# Patient Record
Sex: Female | Born: 1950 | Race: White | Hispanic: No | Marital: Married | Smoking: Never smoker
Health system: Southern US, Community
[De-identification: ages and names within clinical notes are randomized; demographics above are authoritative.]

## PROBLEM LIST (undated history)

## (undated) DIAGNOSIS — K644 Residual hemorrhoidal skin tags: Secondary | ICD-10-CM

## (undated) DIAGNOSIS — M1990 Unspecified osteoarthritis, unspecified site: Secondary | ICD-10-CM

## (undated) DIAGNOSIS — R7301 Impaired fasting glucose: Secondary | ICD-10-CM

## (undated) DIAGNOSIS — K5790 Diverticulosis of intestine, part unspecified, without perforation or abscess without bleeding: Secondary | ICD-10-CM

## (undated) DIAGNOSIS — E785 Hyperlipidemia, unspecified: Secondary | ICD-10-CM

## (undated) DIAGNOSIS — N8502 Endometrial intraepithelial neoplasia [EIN]: Secondary | ICD-10-CM

## (undated) DIAGNOSIS — K648 Other hemorrhoids: Secondary | ICD-10-CM

## (undated) DIAGNOSIS — Z780 Asymptomatic menopausal state: Secondary | ICD-10-CM

## (undated) DIAGNOSIS — I10 Essential (primary) hypertension: Secondary | ICD-10-CM

## (undated) DIAGNOSIS — N926 Irregular menstruation, unspecified: Secondary | ICD-10-CM

## (undated) HISTORY — DX: Asymptomatic menopausal state: Z78.0

## (undated) HISTORY — DX: Hyperlipidemia, unspecified: E78.5

## (undated) HISTORY — DX: Essential (primary) hypertension: I10

## (undated) HISTORY — DX: Unspecified osteoarthritis, unspecified site: M19.90

## (undated) HISTORY — DX: Diverticulosis of intestine, part unspecified, without perforation or abscess without bleeding: K57.90

## (undated) HISTORY — DX: Endometrial intraepithelial neoplasia (EIN): N85.02

## (undated) HISTORY — DX: Residual hemorrhoidal skin tags: K64.4

## (undated) HISTORY — PX: TUBAL LIGATION: SHX77

## (undated) HISTORY — DX: Impaired fasting glucose: R73.01

## (undated) HISTORY — DX: Other hemorrhoids: K64.8

## (undated) HISTORY — DX: Irregular menstruation, unspecified: N92.6

---

## 2004-08-18 HISTORY — PX: TOTAL ABDOMINAL HYSTERECTOMY W/ BILATERAL SALPINGOOPHORECTOMY: SHX83

## 2004-08-25 LAB — HM MAMMOGRAPHY: HM Mammogram: 2

## 2004-08-25 LAB — HM COLONOSCOPY

## 2004-11-14 ENCOUNTER — Ambulatory Visit: Payer: Self-pay | Admitting: Gastroenterology

## 2004-11-15 ENCOUNTER — Ambulatory Visit: Payer: Self-pay | Admitting: Gastroenterology

## 2005-03-12 ENCOUNTER — Other Ambulatory Visit: Admission: RE | Admit: 2005-03-12 | Discharge: 2005-03-12 | Payer: Self-pay | Admitting: Family Medicine

## 2005-04-11 ENCOUNTER — Encounter: Admission: RE | Admit: 2005-04-11 | Discharge: 2005-04-11 | Payer: Self-pay | Admitting: Obstetrics and Gynecology

## 2007-04-26 LAB — HM PAP SMEAR

## 2007-05-12 ENCOUNTER — Other Ambulatory Visit: Admission: RE | Admit: 2007-05-12 | Discharge: 2007-05-12 | Payer: Self-pay | Admitting: Obstetrics and Gynecology

## 2007-08-19 HISTORY — PX: KNEE ARTHROSCOPY: SUR90

## 2007-11-04 ENCOUNTER — Encounter: Admission: RE | Admit: 2007-11-04 | Discharge: 2007-11-04 | Payer: Self-pay | Admitting: Family Medicine

## 2007-11-25 ENCOUNTER — Ambulatory Visit (HOSPITAL_COMMUNITY): Admission: RE | Admit: 2007-11-25 | Discharge: 2007-11-25 | Payer: Self-pay | Admitting: Orthopedic Surgery

## 2009-06-19 IMAGING — CR DG CHEST 2V
2 series · 2 of 2 positions shown · non-contrast
Comparison: None

CLINICAL DATA: Preop for right knee arthroscopy

CHEST - 2 VIEW

[view not recorded (1 of 2)]
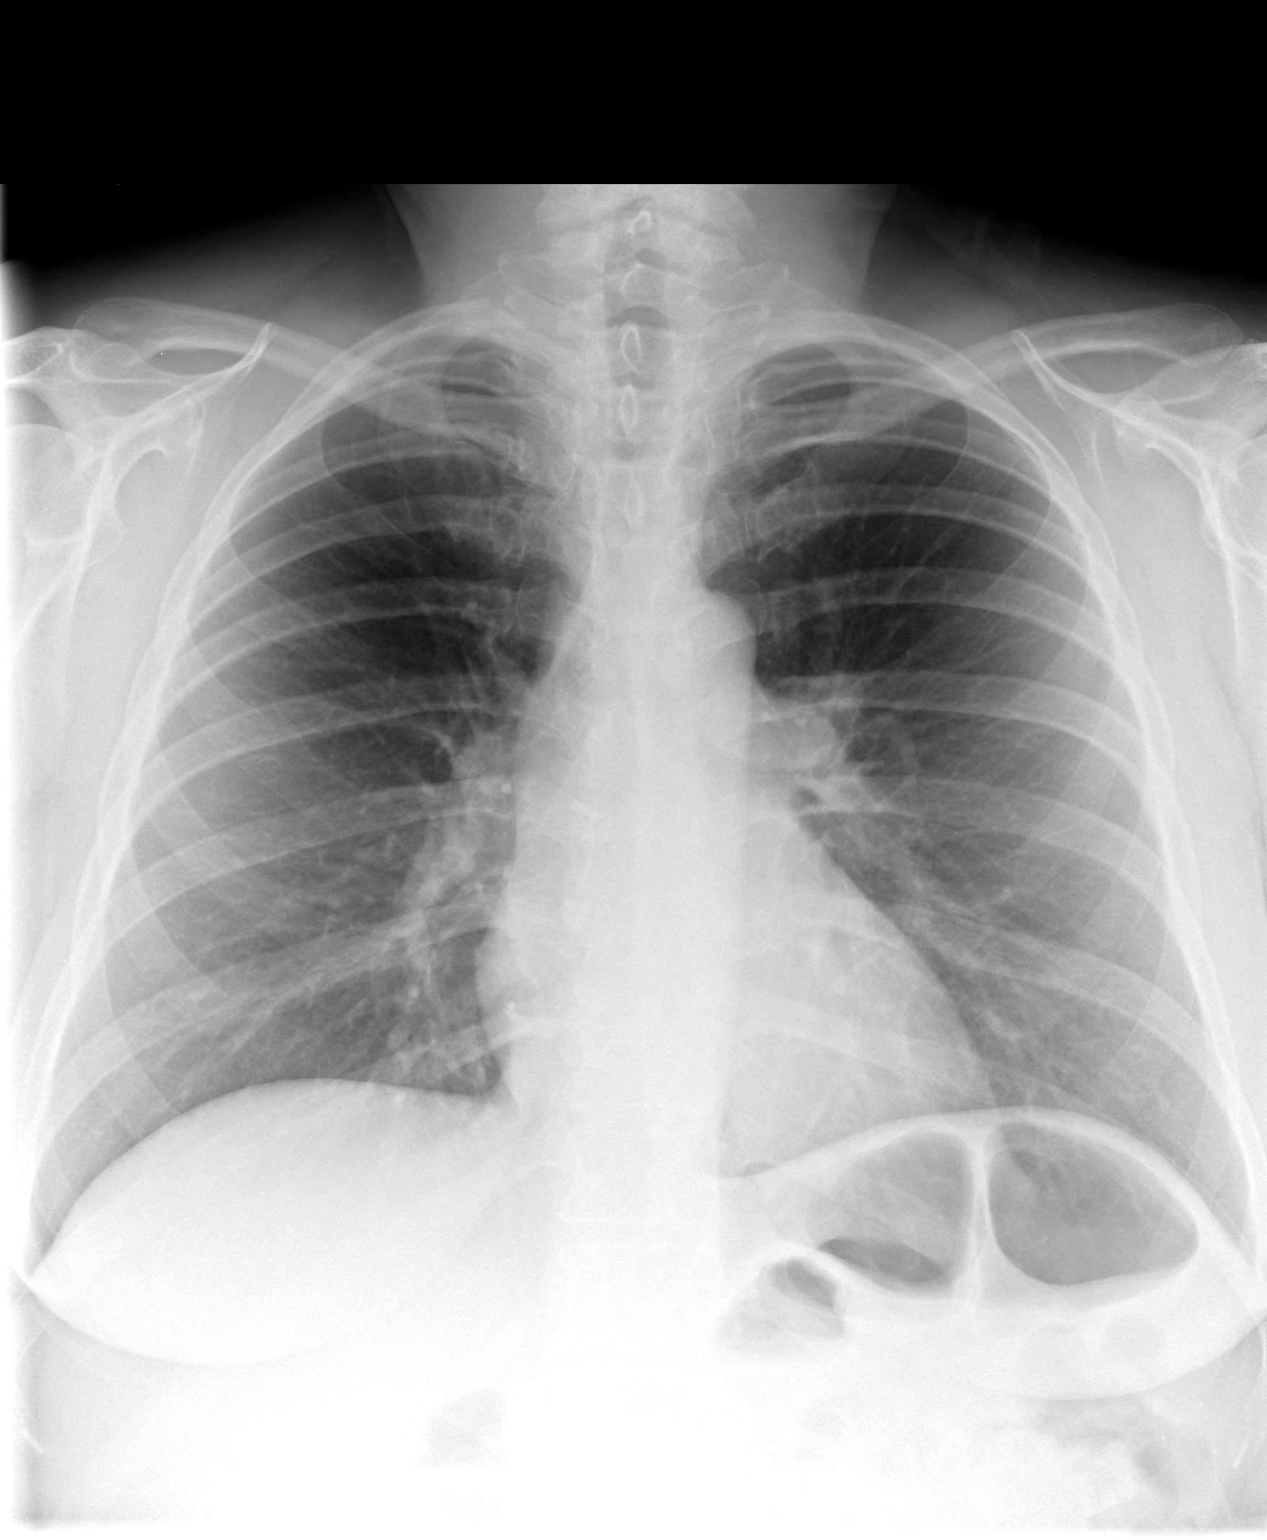

[view not recorded (2 of 2)]
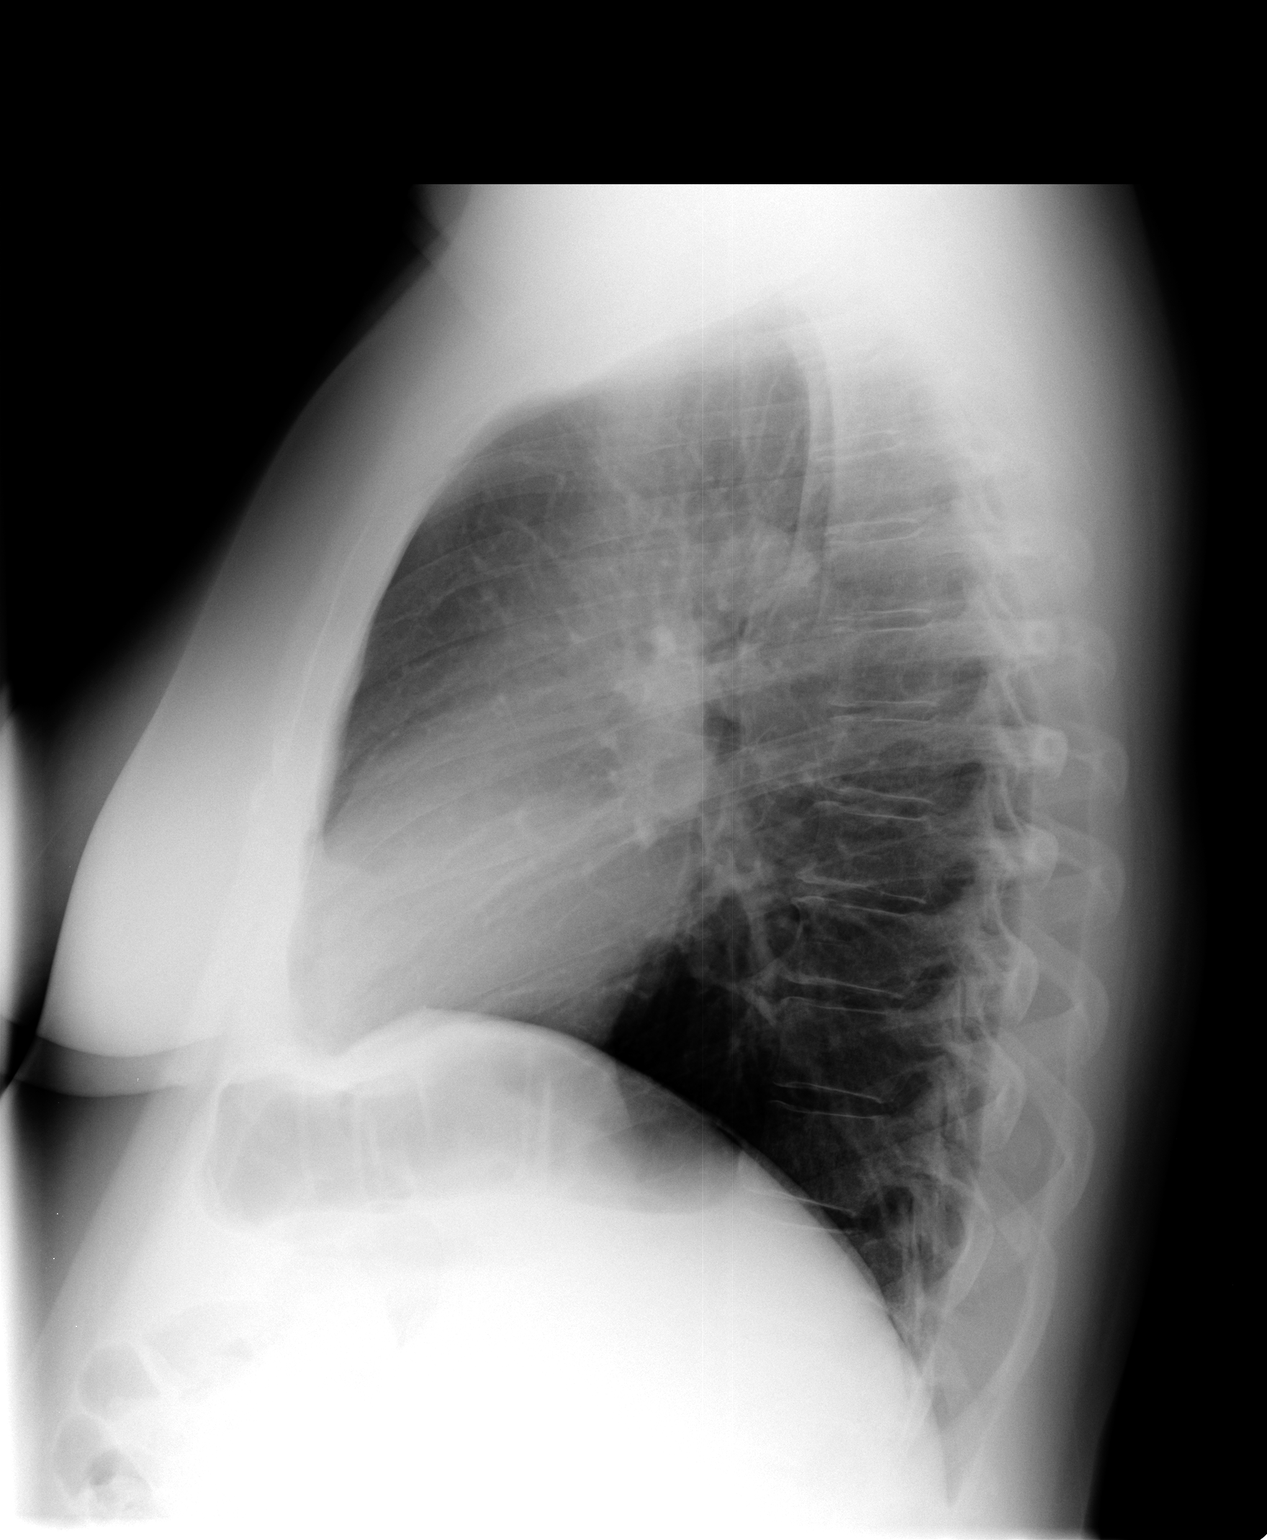

[2 of 2 positions shown; findings below may reference images not displayed]

FINDINGS: Heart and lungs normal.  There is slight fullness of the
left hilum that looks like a minimally prominent pulmonary artery.
I cannot exclude left hilar adenopathy.  I think this is unlikely
to be clinically significant.  However, if the patient has had an
outside chest x-rays, it would be worthwhile attempting to obtain
those images for comparison, or at least reports of the prior
exams, if possible. If none are available, I would recommend a
follow-up chest x-ray in 6 months.  I see no need for CT at this
time.

Osseous structures intact.
IMPRESSION: 1.  Slight prominence of the left hilum - probably vascular.
Cannot rule out left hilar adenopathy.  See above discussion.
2.  Lungs clear.

## 2010-12-31 NOTE — Op Note (Signed)
NAME:  Taylor Jones, Taylor Jones                 ACCOUNT NO.:  [id number]   MEDICAL RECORD NO.:  [id number]          PATIENT TYPE:  AMB   LOCATION:  DAY                          FACILITY:  [facility]   PHYSICIAN:  Georges Lynch. Gioffre, M.D.DATE OF BIRTH:  July 10, 1951   DATE OF PROCEDURE:  11/25/2007  DATE OF DISCHARGE:                               OPERATIVE REPORT   SURGEON:  Georges Lynch. Darrelyn Hillock, M.D.   ASSISTANT:  Nurse.   PREOPERATIVE DIAGNOSIS:  Degenerative arthritis of the right knee.   POSTOPERATIVE DIAGNOSIS:  Degenerative arthritis of the right knee.   PROCEDURE:  1. Diagnostic arthroscopy of the right knee.  2. Abrasion chondroplasty of the medial femoral condyle, right knee.  3. Partial medial meniscectomy, posterior horn, right knee.  4. Partial lateral meniscectomy, right knee.  5. Synovectomy, suprapatellar pouch, right knee.   PROCEDURE:  Under general anesthesia, routine orthopedic prep and drape  of the right lower extremity was carried out.  The patient had 2 grams  of IV Ancef.   At this time, a small punctate incision was made in suprapatellar pouch.  Inflow cannula was inserted, and the knee was distended with saline.  Another small punctate incision was made in the anterolateral joint.  The arthroscope was entered with a lateral approach, and a complete  diagnostic arthroscopy was carried out.  I went up into the  suprapatellar pouch.  She had severe synovitis.  I introduced the  ArthroCare and did a partial synovectomy.  I went back down into the  lateral joint.  She had a torn lateral meniscus in the midportion.  I  introduced the shaver suction device and did a partial lateral  meniscectomy.  I then inspected the cruciates.  They were intact.  In  the medial joint, she had SIGNIFICANT changes.  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  I introduced the shaver  suction device and did an abrasion chondroplasty.  I also did a partial  medial meniscectomy  of the posterior horn.  I did introduce the nerve  hook to make sure there were no other problems with the medial meniscus.  It was rather bound down nicely to the ligamentous structures.  I  thoroughly irrigated out the area, and there no other abnormalities  noted.  I removed the fluid, closed all three punctate incisions with 3-  0 nylon suture.  I injected 30 mL of 0.5% Marcaine with epinephrine into  the knee joint.  A sterile Neosporin bundle dressing was applied.   Postoperatively, she will be on aspirin 325 mg b.i.d. today and b.i.d.  for 2 weeks, she will be on Dilaudid, 2 mg every 4 hours p.r.n. for  pain.  She will ambulate with a walker, partial to full weightbearing as  tolerated.  She will call the office to see me in 12-14 days or prior to  that if there are any problem.           ______________________________  Georges Lynch Darrelyn Hillock, M.D.     RAG/MEDQ  D:  11/25/2007  T:  11/25/2007  Job:  [id number] 

## 2011-05-13 LAB — COMPREHENSIVE METABOLIC PANEL
AST: 17
Alkaline Phosphatase: 49
CO2: 30
GFR calc non Af Amer: >60
Glucose, Bld: 114 — ABNORMAL HIGH
Total Bilirubin: 1

## 2011-05-13 LAB — CBC
HCT: 42.5
Platelets: 220
RDW: 12.7

## 2011-08-26 ENCOUNTER — Encounter: Payer: Self-pay | Admitting: Internal Medicine

## 2011-09-01 ENCOUNTER — Ambulatory Visit (INDEPENDENT_AMBULATORY_CARE_PROVIDER_SITE_OTHER): Payer: BC Managed Care – PPO | Admitting: Family Medicine

## 2011-09-01 ENCOUNTER — Encounter: Payer: Self-pay | Admitting: Family Medicine

## 2011-09-01 VITALS — BP 120/86 | HR 68 | Ht 66.0 in | Wt 228.0 lb

## 2011-09-01 DIAGNOSIS — R5381 Other malaise: Secondary | ICD-10-CM

## 2011-09-01 DIAGNOSIS — Z23 Encounter for immunization: Secondary | ICD-10-CM

## 2011-09-01 DIAGNOSIS — R7301 Impaired fasting glucose: Secondary | ICD-10-CM

## 2011-09-01 DIAGNOSIS — E559 Vitamin D deficiency, unspecified: Secondary | ICD-10-CM | POA: Insufficient documentation

## 2011-09-01 DIAGNOSIS — Z0000 Encounter for general adult medical examination without abnormal findings: Secondary | ICD-10-CM

## 2011-09-01 DIAGNOSIS — R5383 Other fatigue: Secondary | ICD-10-CM

## 2011-09-01 DIAGNOSIS — E7800 Pure hypercholesterolemia, unspecified: Secondary | ICD-10-CM | POA: Insufficient documentation

## 2011-09-01 DIAGNOSIS — I10 Essential (primary) hypertension: Secondary | ICD-10-CM | POA: Insufficient documentation

## 2011-09-01 LAB — CBC WITH DIFFERENTIAL/PLATELET
Basophils Absolute: 0 10*3/uL (ref 0.0–0.1)
HCT: 49.9 % — ABNORMAL HIGH (ref 36.0–46.0)
Hemoglobin: 16.4 g/dL — ABNORMAL HIGH (ref 12.0–15.0)
Neutro Abs: 3.3 10*3/uL (ref 1.7–7.7)
Platelets: 220 10*3/uL (ref 150–400)
RBC: 5.37 MIL/uL — ABNORMAL HIGH (ref 3.87–5.11)
RDW: 13 % (ref 11.5–15.5)
WBC: 6.3 10*3/uL (ref 4.0–10.5)

## 2011-09-01 LAB — POCT URINALYSIS DIPSTICK
Bilirubin, UA: NEGATIVE
Ketones, UA: NEGATIVE
Leukocytes, UA: NEGATIVE
Protein, UA: NEGATIVE
Spec Grav, UA: 1.01
Urobilinogen, UA: NEGATIVE
pH, UA: 5

## 2011-09-01 LAB — COMPREHENSIVE METABOLIC PANEL
ALT: 66 U/L — ABNORMAL HIGH (ref 0–35)
Alkaline Phosphatase: 68 U/L (ref 39–117)
BUN: 12 mg/dL (ref 6–23)
CO2: 29 mEq/L (ref 19–32)
Glucose, Bld: 154 mg/dL — ABNORMAL HIGH (ref 70–99)
Sodium: 140 mEq/L (ref 135–145)
Total Bilirubin: 0.6 mg/dL (ref 0.3–1.2)

## 2011-09-01 LAB — LIPID PANEL
HDL: 50 mg/dL (ref >39)
LDL Cholesterol: 106 mg/dL — ABNORMAL HIGH (ref 0–99)
Total CHOL/HDL Ratio: 3.6 Ratio

## 2011-09-01 LAB — HEMOGLOBIN A1C: Hgb A1c MFr Bld: 8.5 % — ABNORMAL HIGH (ref <5.7)

## 2011-09-01 MED ORDER — AMLODIPINE BESYLATE 10 MG PO TABS: 10.0000 mg | ORAL_TABLET | Freq: Every day | ORAL | Status: DC

## 2011-09-01 MED ORDER — SIMVASTATIN 20 MG PO TABS: 20.0000 mg | ORAL_TABLET | Freq: Every evening | ORAL | Status: DC

## 2011-09-01 NOTE — Progress Notes (Signed)
Taylor Jones is a 61 y.o. female who presents for a complete physical.  She has the following concerns:  Med check for HTN and cholesterol.  Needs refills and labs.  HTN:  Doesn't check BP elsewhere.  Denies headaches or dizziness. Denies chest pain, palpitations, edema Chol:  Denies side effects to medications, myalgias.  Trying to follow lowfat, low cholesterol diet, lost 11 pounds since her mother passed away in the fall. ? If needs pap.  Is s/p hysterectomy for atypical hyperplasia. Had some bleeding related to granulation tissue for a while, requiring silver nitrate treatments (many), but hasn't had any recent problems.  Immunization History  Administered Date(s) Administered  . Tdap 05/10/2007   Last Pap smear: can't recall (9/08 per records) Last mammogram: approx 4 years ago Last colonoscopy: 1/06 Last DEXA: never Dentist: twice yearly Ophtho: every 2 years, scheduled for later this month Exercise: 30 minutes daily, walking, 4-5 days/week  Past Medical History  Diagnosis Date  . Hypertension   . Hyperlipidemia   . Impaired fasting glucose   . Osteoarthritis     of hands and knees  . Irregular menses   . Postmenopausal   . Internal hemorrhoids   . External hemorrhoids   . Diverticulosis   . Atypical endometrial hyperplasia     s/p TAH-BSO.  had bleeding from granulation tissue (requiring treatment with silver nitrate, eventually resolved)    Past Surgical History  Procedure Date  . Cesarean section     x3  . Total abdominal hysterectomy w/ bilateral salpingoophorectomy 01-Feb-2005  . Tubal ligation   . Knee arthroscopy 02-02-08    R knee, meniscal tear, Dr. Darrelyn Hillock    History   Social History  . Marital Status: Married    Spouse Name: N/A    Number of Children: 3  . Years of Education: N/A   Occupational History  . Not on file.   Social History Main Topics  . Smoking status: Never Smoker   . Smokeless tobacco: Never Used  . Alcohol Use: Yes     Maybe one drink  per week.  . Drug Use: No  . Sexually Active: Yes -- Female partner(s)   Other Topics Concern  . Not on file   Social History Narrative   Lives with husband.  Children live in [place] and [place].  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 Diabetes Mother     diet controlled  . Hypertension Mother   . Hyperlipidemia Mother   . Dementia Mother   . Heart attack Father   . Hyperlipidemia Father   . Hypertension Sister   . Hyperlipidemia Sister   . Osteoarthritis Sister   . Heart attack Brother   . Hyperlipidemia Brother   . Diabetes Maternal Aunt   . Diabetes Maternal Grandmother    Current outpatient prescriptions:amLODipine (NORVASC) 10 MG tablet, Take 1 tablet (10 mg total) by mouth daily., Disp: 90 tablet, Rfl: 1;  Glucosamine-Chondroit-Vit C-Mn (GLUCOSAMINE-CHONDROITIN) CAPS, Take 1 capsule by mouth daily. 1500/1200, Disp: , Rfl: ;  Multiple Vitamins-Minerals (MULTIVITAMIN WITH MINERALS) tablet, Take 1 tablet by mouth daily.  , Disp: , Rfl:  simvastatin (ZOCOR) 20 MG tablet, Take 1 tablet (20 mg total) by mouth every evening., Disp: 90 tablet, Rfl: 1;  DISCONTD: amLODipine (NORVASC) 10 MG tablet, Take 10 mg by mouth daily.  , Disp: , Rfl: ;  DISCONTD: simvastatin (ZOCOR) 20 MG  tablet, Take 20 mg by mouth every evening.  , Disp: , Rfl:   No Known Allergies  ROS: The patient denies anorexia, fever,  Headaches (occasional tension headache),  vision changes, ear pain, sore throat, breast concerns (but doesn't do self breast exams), chest pain, palpitations, dizziness, syncope, dyspnea on exertion, cough, swelling, nausea, vomiting, diarrhea, constipation, abdominal pain, melena, hematochezia, indigestion/heartburn, hematuria, incontinence, dysuria, vaginal bleeding, discharge, odor or itch, genital lesions, joint pains, numbness, tingling, weakness, tremor, suspicious skin lesions, depression, anxiety, abnormal  bleeding/bruising, or enlarged lymph nodes. +intentional weight loss; some decreased hearing noted. +indigestion--better if avoids caffeine. +arthritis pain in hands, feet, knees.  Moods and sleep are much improved since her mother passed away.  PHYSICAL EXAM: BP 120/86  Pulse 68  Ht 5\' 6"  (1.676 m)  Wt 228 lb (103.42 kg)  BMI 36.80 kg/m2  General Appearance:    Alert, cooperative, no distress, appears stated age  Head:    Normocephalic, without obvious abnormality, atraumatic  Eyes:    PERRL, conjunctiva/corneas clear, EOM's intact, fundi    benign  Ears:    Normal TM's and external ear canals  Nose:   Nares normal, mucosa normal, no drainage or sinus   tenderness  Throat:   Lips, mucosa, and tongue normal; teeth and gums normal  Neck:   Supple, no lymphadenopathy;  thyroid:  no   enlargement/tenderness/nodules; no carotid   bruit or JVD  Back:    Spine nontender, no curvature, ROM normal, no CVA     tenderness  Lungs:     Clear to auscultation bilaterally without wheezes, rales or     ronchi; respirations unlabored  Chest Wall:    No tenderness or deformity   Heart:    Regular rate and rhythm, S1 and S2 normal, no murmur, rub   or gallop  Breast Exam:    No tenderness, masses, or nipple discharge or inversion.      No axillary lymphadenopathy  Abdomen:     Soft, non-tender, nondistended, normoactive bowel sounds,    no masses, no hepatosplenomegaly  Genitalia:    Normal external genitalia without lesions.  BUS and vagina normal, mild atrophic changes.  Normal appearing vaginal cuff. No vaginal discharge.  Bimanual exam normal--no mass or adnexal tenderness.  Uterus surgically absent.  Rectal:    Normal tone, no masses or tenderness; guaiac positive stool (inflamed hemorrhoid, bled this morning per pt)  Extremities:   No clubbing, cyanosis or edema  Pulses:   2+ and symmetric all extremities  Skin:   Skin color, texture, turgor normal, no rashes or lesions  Lymph nodes:   Cervical,  supraclavicular, and axillary nodes normal  Neurologic:   CNII-XII intact, normal strength, sensation and gait; reflexes 2+ and symmetric throughout          Psych:   Normal mood, affect, hygiene and grooming.     ASSESSMENT/PLAN:  1. Routine general medical examination at a health care facility  POCT Urinalysis Dipstick  2. Need for prophylactic vaccination and inoculation against influenza  Flu vaccine greater than or equal to 3yo preservative free IM  3. Essential hypertension, benign  Comprehensive metabolic panel, amLODipine (NORVASC) 10 MG tablet  4. Pure hypercholesterolemia  Lipid panel, simvastatin (ZOCOR) 20 MG tablet  5. Unspecified vitamin D deficiency  Vitamin D 25 hydroxy  6. Other malaise and fatigue  CBC with Differential, TSH  7. Impaired fasting glucose  Hemoglobin A1c   Heme+ stool related to hemorrhoids.  Kit given, to  do/return after hemorrhoid flare resolves.  Discussed monthly self breast exams and yearly mammograms; at least 30 minutes of aerobic activity at least 5 days/week; proper sunscreen use reviewed; healthy diet, including goals of calcium and vitamin D intake and alcohol recommendations (less than or equal to 1 drink/day) reviewed; regular seatbelt use; changing batteries in smoke detectors.  Immunization recommendations discussed--UTD.  Colonoscopy recommendations reviewed--UTD  Shingles vaccine next year, check insurance coverage Yearly mammograms are strongly encouraged, and she is agreeable. Will call to schedule mammogram.  F/u 1 year, sooner if dizziness or other concerns.  Periodically check BP elsewhere, return sooner if persistently >135-140/85-90

## 2011-09-01 NOTE — Patient Instructions (Addendum)
HEALTH MAINTENANCE RECOMMENDATIONS:  It is recommended that you get at least 30 minutes of aerobic exercise at least 5 days/week (for weight loss, you may need as much as 60-90 minutes). This can be any activity that gets your heart rate up. This can be divided in 10-15 minute intervals if needed, but try and build up your endurance at least once a week.  Weight bearing exercise is also recommended twice weekly.  Eat a healthy diet with lots of vegetables, fruits and fiber.  "Colorful" foods have a lot of vitamins (ie green vegetables, tomatoes, red peppers, etc).  Limit sweet tea, regular sodas and alcoholic beverages, all of which has a lot of calories and sugar.  Up to 1 alcoholic drink daily may be beneficial for women (unless trying to lose weight, watch sugars).  Drink a lot of water.  Calcium recommendations are 1200-1500 mg daily (1500 mg for postmenopausal women or women without ovaries), and vitamin D 1000 IU daily.  This should be obtained from diet and/or supplements (vitamins), and calcium should not be taken all at once, but in divided doses.  Monthly self breast exams and yearly mammograms for women over the age of 35 is recommended.  Sunscreen of at least SPF 30 should be used on all sun-exposed parts of the skin when outside between the hours of 10 am and 4 pm (not just when at beach or pool, but even with exercise, golf, tennis, and yard work!)  Use a sunscreen that says "broad spectrum" so it covers both UVA and UVB rays, and make sure to reapply every 1-2 hours.  Remember to change the batteries in your smoke detectors when changing your clock times in the spring and fall.  Use your seat belt every time you are in a car, and please drive safely and not be distracted with cell phones and texting while driving.  Zostavax (shingles vaccine) due age 3.  I recommend you check your insurance coverage for this vaccine before your physical next year.  Check BP about once a month, and  write down..  Do stool cards when your hemorrhoids aren't flaring.

## 2011-09-02 ENCOUNTER — Encounter: Payer: Self-pay | Admitting: Family Medicine

## 2011-09-02 DIAGNOSIS — E1165 Type 2 diabetes mellitus with hyperglycemia: Secondary | ICD-10-CM

## 2011-09-02 LAB — VITAMIN D 25 HYDROXY (VIT D DEFICIENCY, FRACTURES): Vit D, 25-Hydroxy: 42 ng/mL (ref 30–89)

## 2011-09-15 ENCOUNTER — Encounter: Payer: Self-pay | Admitting: Family Medicine

## 2011-09-15 ENCOUNTER — Ambulatory Visit (INDEPENDENT_AMBULATORY_CARE_PROVIDER_SITE_OTHER): Payer: BC Managed Care – PPO | Admitting: Family Medicine

## 2011-09-15 VITALS — BP 118/78 | HR 72 | Ht 66.0 in | Wt 227.0 lb

## 2011-09-15 DIAGNOSIS — R945 Abnormal results of liver function studies: Secondary | ICD-10-CM

## 2011-09-15 DIAGNOSIS — IMO0001 Reserved for inherently not codable concepts without codable children: Secondary | ICD-10-CM

## 2011-09-15 DIAGNOSIS — I10 Essential (primary) hypertension: Secondary | ICD-10-CM

## 2011-09-15 DIAGNOSIS — E7800 Pure hypercholesterolemia, unspecified: Secondary | ICD-10-CM

## 2011-09-15 DIAGNOSIS — R7989 Other specified abnormal findings of blood chemistry: Secondary | ICD-10-CM

## 2011-09-15 MED ORDER — METFORMIN HCL ER 500 MG PO TB24: ORAL_TABLET | ORAL | Status: DC

## 2011-09-15 NOTE — Progress Notes (Signed)
Patient presents for follow up on labs.  Labs revealed a new diagnosis of diabetes.  She previously had some impaired fasting glucose. Admits to very poor diet through November, eating a lot of sweets, and not able to exercise, related to her mother's health and death.  Started eating better and exercising some in Mid-November, but wasn't as good over Christmas.  Since January she has been exercising more and on a better diet. Exercising 30 minutes daily, 5 days/week.  Has lost some weight per her scale at home.  Denies excessive thirty or urination.  She has an appointment with eye doctor for next week.  She denies any numbness/tingling/pain in feet, or any sores  Past Medical History  Diagnosis Date  . Hypertension   . Hyperlipidemia   . Impaired fasting glucose   . Osteoarthritis     of hands and knees  . Irregular menses   . Postmenopausal   . Internal hemorrhoids   . External hemorrhoids   . Diverticulosis   . Atypical endometrial hyperplasia     s/p TAH-BSO.  had bleeding from granulation tissue (requiring treatment with silver nitrate, eventually resolved)  . Diabetes mellitus     Past Surgical History  Procedure Date  . Cesarean section     x3  . Total abdominal hysterectomy w/ bilateral salpingoophorectomy 08-Feb-2005  . Tubal ligation   . Knee arthroscopy 2008/02/09    R knee, meniscal tear, Dr. Darrelyn Hillock    History   Social History  . Marital Status: Married    Spouse Name: N/A    Number of Children: 3  . Years of Education: N/A   Occupational History  . Not on file.   Social History Main Topics  . Smoking status: Never Smoker   . Smokeless tobacco: Never Used  . Alcohol Use: Yes     Maybe one drink per week.  . Drug Use: No  . Sexually Active: Yes -- Female partner(s)   Other Topics Concern  . Not on file   Social History Narrative   Lives with husband.  Children live in [place] and [place].  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 Diabetes Mother     diet controlled  . Hypertension Mother   . Hyperlipidemia Mother   . Dementia Mother   . Heart attack Father   . Hyperlipidemia Father   . Hypertension Sister   . Hyperlipidemia Sister   . Osteoarthritis Sister   . Heart attack Brother   . Hyperlipidemia Brother   . Diabetes Maternal Aunt   . Diabetes Maternal Grandmother    Current Outpatient Prescriptions on File Prior to Visit  Medication Sig Dispense Refill  . amLODipine (NORVASC) 10 MG tablet Take 1 tablet (10 mg total) by mouth daily.  90 tablet  1  . Glucosamine-Chondroit-Vit C-Mn (GLUCOSAMINE-CHONDROITIN) CAPS Take 1 capsule by mouth daily. 1500/1200      . Multiple Vitamins-Minerals (MULTIVITAMIN WITH MINERALS) tablet Take 1 tablet by mouth daily.        . simvastatin (ZOCOR) 20 MG tablet Take 1 tablet (20 mg total) by mouth every evening.  90 tablet  1   No Known Allergies  ROS:  Denies chest pain, headaches, dizziness, GI complaints or other concerns  PHYSICAL EXAM: BP 118/78  Pulse 72  Ht 5\' 6"  (1.676 m)  Wt 227 lb (102.967 kg)  BMI 36.64 kg/m2 Well developed,  pleasant female in no distress Normal diabetic foot exam Some discoloration of toenails consistent with onychomycosis Psych: normal mood, affect, hygiene and grooming  Lab Results  Component Value Date   HGBA1C 8.5* 09/01/2011     Chemistry      Component Value Date/Time   NA 140 09/01/2011 1205   K 4.1 09/01/2011 1205   CL 102 09/01/2011 1205   CO2 29 09/01/2011 1205   BUN 12 09/01/2011 1205   CREATININE 0.91 09/01/2011 1205   CREATININE 0.81 11/23/2007 0825      Component Value Date/Time   CALCIUM 9.4 09/01/2011 1205   ALKPHOS 68 09/01/2011 1205   AST 49* 09/01/2011 1205   ALT 66* 09/01/2011 1205   BILITOT 0.6 09/01/2011 1205     Lab Results  Component Value Date   CHOL 180 09/01/2011   HDL 50 09/01/2011   LDLCALC 106* 09/01/2011   TRIG 119 09/01/2011   CHOLHDL 3.6 09/01/2011   Lab Results   Component Value Date   WBC 6.3 09/01/2011   HGB 16.4* 09/01/2011   HCT 49.9* 09/01/2011   MCV 92.9 09/01/2011   PLT 220 09/01/2011   Lab Results  Component Value Date   TSH 1.179 09/01/2011   ASSESSMENT/PLAN: 1. Type II or unspecified type diabetes mellitus without mention of complication, uncontrolled  metFORMIN (GLUCOPHAGE-XR) 500 MG 24 hr tablet, Hemoglobin A1c, Glucose, random  2. Abnormal LFTs  Hepatic function panel  3. Pure hypercholesterolemia  Lipid panel  4. Essential hypertension, benign     Newly diagnosed diabetes.  Start checking sugars 1-2 times a day, fasting and at least 2 hours after eating (or bedtime) in evening.  (has monitor at home from her mother) Start metformin ER 500 mg once daily in the morning. Increase after a week to 1000mg  daily if no side effects (diarrhea), and sugars remain >130 in the mornings.  Hyperlipidemia: Cut back on eggs to 1-2/week.  Goal LDL<100 (ideally <70).  Elevated LFT's--avoid alcohol, excessive Tylenol  HTN--well controlled.   Educated regarding low cholesterol diet, low carb diet, need for daily exercise and weight loss  F/u 3 months with labs prior

## 2011-09-15 NOTE — Patient Instructions (Signed)
Start checking sugars 1-2 times a day, fasting and at least 2 hours after eating (or bedtime) in evening.   Start metformin ER 500 mg once daily in the morning.  If after a week you aren't having side effects (diarrhea), and sugars remain >130 in the mornings, then increase to 2 500 mg tabs in the morning.  If you have GI side effects (diarrhea) taking 1000 mg all at once, then you can split the dose, taking 500 mg twice daily (breakfast/dinner).   Cut back on eggs to 1-2/week.  Goal LDL<100 (ideally <70).  Diabetes Meal Planning Guide The diabetes meal planning guide is a tool to help you plan your meals and snacks. It is important for people with diabetes to manage their blood glucose (sugar) levels. Choosing the right foods and the right amounts throughout your day will help control your blood glucose. Eating right can even help you improve your blood pressure and reach or maintain a healthy weight. CARBOHYDRATE COUNTING MADE EASY When you eat carbohydrates, they turn to sugar. This raises your blood glucose level. Counting carbohydrates can help you control this level so you feel better. When you plan your meals by counting carbohydrates, you can have more flexibility in what you eat and balance your medicine with your food intake. Carbohydrate counting simply means adding up the total amount of carbohydrate grams in your meals and snacks. Try to eat about the same amount at each meal. Foods with carbohydrates are listed below. Each portion below is 1 carbohydrate serving or 15 grams of carbohydrates. Ask your dietician how many grams of carbohydrates you should eat at each meal or snack. Grains and Starches  1 slice bread.    English muffin or hotdog/hamburger bun.    cup cold cereal (unsweetened).   ? cup cooked pasta or rice.    cup starchy vegetables (corn, potatoes, peas, beans, winter squash).   1 tortilla (6 inches).    bagel.   1 waffle or pancake (size of a CD).    cup  cooked cereal.   4 to 6 small crackers.  *Whole grain is recommended. Fruit  1 cup fresh unsweetened berries, melon, papaya, pineapple.   1 small fresh fruit.    banana or mango.    cup fruit juice (4 oz unsweetened).    cup canned fruit in natural juice or water.   2 tbs dried fruit.   12 to 15 grapes or cherries.  Milk and Yogurt  1 cup fat-free or 1% milk.   1 cup soy milk.   6 oz light yogurt with sugar-free sweetener.   6 oz low-fat soy yogurt.   6 oz plain yogurt.  Vegetables  1 cup raw or  cup cooked is counted as 0 carbohydrates or a "free" food.   If you eat 3 or more servings at 1 meal, count them as 1 carbohydrate serving.  Other Carbohydrates   oz chips or pretzels.    cup ice cream or frozen yogurt.    cup sherbet or sorbet.   2 inch square cake, no frosting.   1 tbs honey, sugar, jam, jelly, or syrup.   2 small cookies.   3 squares of graham crackers.   3 cups popcorn.   6 crackers.   1 cup broth-based soup.   Count 1 cup casserole or other mixed foods as 2 carbohydrate servings.   Foods with less than 20 calories in a serving may be counted as 0 carbohydrates or a "free"  food.  You may want to purchase a book or computer software that lists the carbohydrate gram counts of different foods. In addition, the nutrition facts panel on the labels of the foods you eat are a good source of this information. The label will tell you how big the serving size is and the total number of carbohydrate grams you will be eating per serving. Divide this number by 15 to obtain the number of carbohydrate servings in a portion. Remember, 1 carbohydrate serving equals 15 grams of carbohydrate. SERVING SIZES Measuring foods and serving sizes helps you make sure you are getting the right amount of food. The list below tells how big or small some common serving sizes are.  1 oz.........4 stacked dice.   3 oz........Marland KitchenDeck of cards.   1 tsp.......Marland KitchenTip of  little finger.   1 tbs......Marland KitchenMarland KitchenThumb.   2 tbs.......Marland KitchenGolf ball.    cup......Marland KitchenHalf of a fist.   1 cup.......Marland KitchenA fist.  SAMPLE DIABETES MEAL PLAN Below is a sample meal plan that includes foods from the grain and starches, dairy, vegetable, fruit, and meat groups. A dietician can individualize a meal plan to fit your calorie needs and tell you the number of servings needed from each food group. However, controlling the total amount of carbohydrates in your meal or snack is more important than making sure you include all of the food groups at every meal. You may interchange carbohydrate containing foods (dairy, starches, and fruits). The meal plan below is an example of a 2000 calorie diet using carbohydrate counting. This meal plan has 17 carbohydrate servings. Breakfast  1 cup oatmeal (2 carb servings).    cup light yogurt (1 carb serving).   1 cup blueberries (1 carb serving).    cup almonds.  Snack  1 large apple (2 carb servings).   1 low-fat string cheese stick.  Lunch  Chicken breast salad.   1 cup spinach.    cup chopped tomatoes.   2 oz chicken breast, sliced.   2 tbs low-fat [place] dressing.   12 whole-wheat crackers (2 carb servings).   12 to 15 grapes (1 carb serving).   1 cup low-fat milk (1 carb serving).  Snack  1 cup carrots.    cup hummus (1 carb serving).  Dinner  3 oz broiled salmon.   1 cup brown rice (3 carb servings).  Snack  1  cups steamed broccoli (1 carb serving) drizzled with 1 tsp olive oil and lemon juice.   1 cup light pudding (2 carb servings).  DIABETES MEAL PLANNING WORKSHEET Your dietician can use this worksheet to help you decide how many servings of foods and what types of foods are right for you.  BREAKFAST Food Group and Servings / Carb Servings Grain/Starches __________________________________ Dairy __________________________________________ Vegetable ______________________________________ Fruit  ___________________________________________ Meat __________________________________________ Fat ____________________________________________ LUNCH Food Group and Servings / Carb Servings Grain/Starches ___________________________________ Dairy ___________________________________________ Fruit ____________________________________________ Meat ___________________________________________ Fat _____________________________________________ Laural Golden Food Group and Servings / Carb Servings Grain/Starches ___________________________________ Dairy ___________________________________________ Fruit ____________________________________________ Meat ___________________________________________ Fat _____________________________________________ SNACKS Food Group and Servings / Carb Servings Grain/Starches ___________________________________ Dairy ___________________________________________ Vegetable _______________________________________ Fruit ____________________________________________ Meat ___________________________________________ Fat _____________________________________________ DAILY TOTALS Starches _________________________ Vegetable ________________________ Fruit ____________________________ Dairy ____________________________ Meat ____________________________ Fat ______________________________ Document Released: 05/01/2005 Document Revised: 04/16/2011 Document Reviewed: 03/12/2009 [employer] Patient Information 2012 [place], [place].

## 2011-10-15 ENCOUNTER — Encounter: Payer: Self-pay | Admitting: Gastroenterology

## 2011-12-10 ENCOUNTER — Other Ambulatory Visit: Payer: BC Managed Care – PPO

## 2011-12-10 DIAGNOSIS — E7800 Pure hypercholesterolemia, unspecified: Secondary | ICD-10-CM

## 2011-12-10 DIAGNOSIS — R945 Abnormal results of liver function studies: Secondary | ICD-10-CM

## 2011-12-10 LAB — HEPATIC FUNCTION PANEL
AST: 21 U/L (ref 0–37)
Albumin: 4.5 g/dL (ref 3.5–5.2)
Alkaline Phosphatase: 55 U/L (ref 39–117)
Bilirubin, Direct: 0.1 mg/dL (ref 0.0–0.3)
Indirect Bilirubin: 0.6 mg/dL (ref 0.0–0.9)

## 2011-12-10 LAB — GLUCOSE, RANDOM: Glucose, Bld: 102 mg/dL — ABNORMAL HIGH (ref 70–99)

## 2011-12-10 LAB — LIPID PANEL
Total CHOL/HDL Ratio: 3.5 Ratio
VLDL: 20 mg/dL (ref 0–40)

## 2011-12-10 LAB — HEMOGLOBIN A1C: Hgb A1c MFr Bld: 6.4 % — ABNORMAL HIGH (ref <5.7)

## 2011-12-15 ENCOUNTER — Ambulatory Visit: Payer: BC Managed Care – PPO | Admitting: Family Medicine

## 2011-12-17 ENCOUNTER — Ambulatory Visit (INDEPENDENT_AMBULATORY_CARE_PROVIDER_SITE_OTHER): Payer: BC Managed Care – PPO | Admitting: Family Medicine

## 2011-12-17 ENCOUNTER — Encounter: Payer: Self-pay | Admitting: Family Medicine

## 2011-12-17 VITALS — BP 110/76 | HR 72 | Ht 66.0 in | Wt 209.0 lb

## 2011-12-17 DIAGNOSIS — E119 Type 2 diabetes mellitus without complications: Secondary | ICD-10-CM

## 2011-12-17 DIAGNOSIS — IMO0001 Reserved for inherently not codable concepts without codable children: Secondary | ICD-10-CM

## 2011-12-17 DIAGNOSIS — E7800 Pure hypercholesterolemia, unspecified: Secondary | ICD-10-CM

## 2011-12-17 DIAGNOSIS — I10 Essential (primary) hypertension: Secondary | ICD-10-CM

## 2011-12-17 MED ORDER — METFORMIN HCL ER 500 MG PO TB24: ORAL_TABLET | ORAL | Status: DC

## 2011-12-17 MED ORDER — ATORVASTATIN CALCIUM 20 MG PO TABS: 20.0000 mg | ORAL_TABLET | Freq: Every day | ORAL | Status: DC

## 2011-12-17 NOTE — Progress Notes (Signed)
Patient presents for f/u labs.  She was diagnosed with diabetes 3 months ago. Taking 1000 mg of metformin daily without any side effects.  Sugars are running in the low 100's, up to teens if less careful with diet.  Doesn't check it as often once the sugars improved, checking 1-2x/week or more.  Hasn't set up diabetes eye exam yet.  She has been watching her diet, exercising 30 minutes 4-5 days/week.  Checking feet regularly without concerns. She lost 18 pounds since the last visit.  Hyperlipidemia:  Was above goal LDL for diabetics at last visit, and is here to follow-up after dietary trial.  Cut back on number of eggs/week (2/wk), cheese 3-4x/week, red meat 2-3x/week.   HTN--doesn't check BP elsewhere. Denies headaches, dizziness.  Ongoing problems with hemorrhoidal bleeding since last visit, so hasn't been able to return stool cards.  Having some constipation and straining.  Doesn't feel anything on the outside.  Colonoscopy was in 2005-01-20.  Past Medical History  Diagnosis Date  . Hypertension   . Hyperlipidemia   . Impaired fasting glucose   . Osteoarthritis     of hands and knees  . Irregular menses   . Postmenopausal   . Internal hemorrhoids   . External hemorrhoids   . Diverticulosis   . Atypical endometrial hyperplasia     s/p TAH-BSO.  had bleeding from granulation tissue (requiring treatment with silver nitrate, eventually resolved)  . Diabetes mellitus     Past Surgical History  Procedure Date  . Cesarean section     x3  . Total abdominal hysterectomy w/ bilateral salpingoophorectomy 2005-01-20  . Tubal ligation   . Knee arthroscopy 01/21/2008    R knee, meniscal tear, Dr. Darrelyn Hillock    History   Social History  . Marital Status: Married    Spouse Name: N/A    Number of Children: 3  . Years of Education: N/A   Occupational History  . Not on file.   Social History Main Topics  . Smoking status: Never Smoker   . Smokeless tobacco: Never Used  . Alcohol Use: Yes     Maybe one  drink per week.  . Drug Use: No  . Sexually Active: Yes -- Female partner(s)   Other Topics Concern  . Not on file   Social History Narrative   Lives with husband.  Children live in [place] and [place].  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 Diabetes Mother     diet controlled  . Hypertension Mother   . Hyperlipidemia Mother   . Dementia Mother   . Heart attack Father   . Hyperlipidemia Father   . Hypertension Sister   . Hyperlipidemia Sister   . Osteoarthritis Sister   . Heart attack Brother   . Hyperlipidemia Brother   . Diabetes Maternal Aunt   . Diabetes Maternal Grandmother    Current Outpatient Prescriptions on File Prior to Visit  Medication Sig Dispense Refill  . amLODipine (NORVASC) 10 MG tablet Take 1 tablet (10 mg total) by mouth daily.  90 tablet  1  . Glucosamine-Chondroit-Vit C-Mn (GLUCOSAMINE-CHONDROITIN) CAPS Take 1 capsule by mouth daily. 1500/1200      . Multiple Vitamins-Minerals (MULTIVITAMIN WITH MINERALS) tablet Take 1 tablet by mouth daily.        Marland Kitchen DISCONTD: metFORMIN (GLUCOPHAGE-XR) 500 MG 24 hr tablet Take 1 tablet daily with breakfast.  Increase to 2  tablets daily with breakfast after a week  60 tablet  2  . atorvastatin (LIPITOR) 20 MG tablet Take 1 tablet (20 mg total) by mouth daily.  90 tablet  3   No Known Allergies  ROS:  Denies headaches, chest pain, palpitations, shortness of breath, edema, fever, URI symptoms, cough, GI complaints, GU complaints, skin rashes/lesions. +hemorrhoidal bleeding. See HPI.  PHYSICAL EXAM: BP 110/76  Pulse 72  Ht 5\' 6"  (1.676 m)  Wt 209 lb (94.802 kg)  BMI 33.73 kg/m2 Well developed, pleasant female in no distress Neck: no lymphadenopathy, thyromegaly or bruit Heart: regular rate and rhythm Lungs: clear Abdomen: soft, nontender, no mass Extremities: no edema, 2+ pulse, no lesions, normal sensation Psych: normal mood, affect   Lab  Results  Component Value Date   HGBA1C 6.4* 12/10/2011   Lab Results  Component Value Date   CHOL 174 12/10/2011   HDL 50 12/10/2011   LDLCALC 104* 12/10/2011   TRIG 100 12/10/2011   CHOLHDL 3.5 12/10/2011   Lab Results  Component Value Date   ALT 22 12/10/2011   AST 21 12/10/2011   ALKPHOS 55 12/10/2011   BILITOT 0.7 12/10/2011   Glucose 102 (fasting)  ASSESSMENT/PLAN: 1. Type II or unspecified type diabetes mellitus without mention of complication, not stated as uncontrolled  Hemoglobin A1c, Glucose, random  2. Essential hypertension, benign    3. Pure hypercholesterolemia  atorvastatin (LIPITOR) 20 MG tablet, Lipid panel, Hepatic function panel  4. Type II or unspecified type diabetes mellitus without mention of complication, uncontrolled  metFORMIN (GLUCOPHAGE-XR) 500 MG 24 hr tablet    DM--improved control.  Continue current medications. Schedule annual diabetic eye exam.  HTN--well controlled  Elevated LFT's 3 months ago--resolved.  Will need to monitor closely with change in statin medication.  Hyperlipidemia--not quite at goal--no significant improvement despite improvement in diet.  Change from simvastatin to atorvastatin 20mg .  Risks and side effects reviewed.  Hemorrhoidal bleeding--try OTC Cream such as Anusol.  If ongoing bleeding, consider referral back to GI.  Work on preventing flare of hemorrhoids by increasing fiber, fluids, and trial of stool softener.  F/u 3 months--A1c, LFT, lipid, glu

## 2011-12-17 NOTE — Patient Instructions (Signed)
Stop the simvastatin and change to atorvastatin (generic for Lipitor).   If you start getting any muscle aches or side effects, consider adding Coenzyme Q10.  Continue to follow low cholesterol diet.  Diabetes is much better.  Continue medication, proper diet, and daily exercise.  Continue weight loss.  Hemorrhoids--try Anusol HC suppository (or any other suppository if it is an internal hemorrhoid; the creams only work for external hemorrhoids).  High fiber diet, drink plenty of fluids, and consider adding a stool softener (ie colace) every day, to keep stools softer to prevent straining.

## 2012-03-18 ENCOUNTER — Other Ambulatory Visit: Payer: BC Managed Care – PPO

## 2012-03-18 DIAGNOSIS — E7800 Pure hypercholesterolemia, unspecified: Secondary | ICD-10-CM

## 2012-03-18 DIAGNOSIS — E119 Type 2 diabetes mellitus without complications: Secondary | ICD-10-CM

## 2012-03-18 LAB — HEPATIC FUNCTION PANEL
Albumin: 4.4 g/dL (ref 3.5–5.2)
Alkaline Phosphatase: 61 U/L (ref 39–117)
Indirect Bilirubin: 0.4 mg/dL (ref 0.0–0.9)
Total Bilirubin: 0.5 mg/dL (ref 0.3–1.2)
Total Protein: 7.4 g/dL (ref 6.0–8.3)

## 2012-03-18 LAB — LIPID PANEL
HDL: 52 mg/dL (ref >39)
LDL Cholesterol: 118 mg/dL — ABNORMAL HIGH (ref 0–99)
Triglycerides: 96 mg/dL (ref <150)

## 2012-03-18 LAB — HEMOGLOBIN A1C: Mean Plasma Glucose: 140 mg/dL — ABNORMAL HIGH (ref <117)

## 2012-03-18 LAB — GLUCOSE, RANDOM: Glucose, Bld: 117 mg/dL — ABNORMAL HIGH (ref 70–99)

## 2012-03-25 ENCOUNTER — Ambulatory Visit (INDEPENDENT_AMBULATORY_CARE_PROVIDER_SITE_OTHER): Payer: BC Managed Care – PPO | Admitting: Family Medicine

## 2012-03-25 ENCOUNTER — Encounter: Payer: Self-pay | Admitting: Family Medicine

## 2012-03-25 VITALS — BP 112/82 | HR 68 | Ht 66.0 in | Wt 216.0 lb

## 2012-03-25 DIAGNOSIS — E7800 Pure hypercholesterolemia, unspecified: Secondary | ICD-10-CM

## 2012-03-25 DIAGNOSIS — E119 Type 2 diabetes mellitus without complications: Secondary | ICD-10-CM

## 2012-03-25 DIAGNOSIS — I10 Essential (primary) hypertension: Secondary | ICD-10-CM

## 2012-03-25 MED ORDER — ROSUVASTATIN CALCIUM 10 MG PO TABS: 10.0000 mg | ORAL_TABLET | Freq: Every day | ORAL | Status: DC

## 2012-03-25 NOTE — Progress Notes (Signed)
Chief Complaint  Patient presents with  . Med check    med check-labs done 03/18/12.   HPI:  Diabetes:  Sugars are checked twice a week, ranging 105-115. Still hasn't seen the eye doctor yet, nor made appointment.  She is no longer exercising, as her arthritis has been much more painful.  Having pain in knees, hands, toes.  Pain has been worse since her statin was changed from simvastatin to atorvastatin last visit. She has regained some of the weight she had lost. Denies muscle aches, feels like it is more in her joints.  She is taking glucosamine and chondroitin, but it doesn't help.  HTN--doesn't check BP elsewhere. Denies headaches, dizziness.  Hyperlipidemia--continuing to follow low cholesterol diet--eating eggs 2/wk, red meat 3x/week.  She was changed from 20mg  of simvastatin to 20mg  of atorvastatin at last visit (when still was above LDL goal).  She has had significantly more pain since changing statins.  She was changed to atorvastatin rather than increasing simvastatin dose due to her also being on amlodipine.  Past Medical History  Diagnosis Date  . Hypertension   . Hyperlipidemia   . Impaired fasting glucose   . Osteoarthritis     of hands and knees  . Irregular menses   . Postmenopausal   . Internal hemorrhoids   . External hemorrhoids   . Diverticulosis   . Atypical endometrial hyperplasia     s/p TAH-BSO.  had bleeding from granulation tissue (requiring treatment with silver nitrate, eventually resolved)  . Diabetes mellitus    Past Surgical History  Procedure Date  . Cesarean section     x3  . Total abdominal hysterectomy w/ bilateral salpingoophorectomy 01/26/05  . Tubal ligation   . Knee arthroscopy 01-27-08    R knee, meniscal tear, Dr. Darrelyn Hillock   History   Social History  . Marital Status: Married    Spouse Name: N/A    Number of Children: 3  . Years of Education: N/A   Occupational History  . Not on file.   Social History Main Topics  . Smoking status:  Never Smoker   . Smokeless tobacco: Never Used  . Alcohol Use: Yes     Maybe one drink per week.  . Drug Use: No  . Sexually Active: Yes -- Female partner(s)   Other Topics Concern  . Not on file   Social History Narrative   Lives with husband.  Children live in [place] and [place].  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 amLODipine (NORVASC) 10 MG tablet Take 1 tablet (10 mg total) by mouth daily.  90 tablet  1  . atorvastatin (LIPITOR) 20 MG tablet Take 1 tablet (20 mg total) by mouth daily.  90 tablet  3  . Glucosamine-Chondroit-Vit C-Mn (GLUCOSAMINE-CHONDROITIN) CAPS Take 1 capsule by mouth daily. 1500/1200      . metFORMIN (GLUCOPHAGE-XR) 500 MG 24 hr tablet Take 1 tablet daily with breakfast.  Increase to 2 tablets daily with breakfast after a week  180 tablet  1  . Multiple Vitamins-Minerals (MULTIVITAMIN WITH MINERALS) tablet Take 1 tablet by mouth daily.         No Known Allergies  No longer having hemorrhoidal issues. Denies fevers, URI symptoms, headaches, chest pain, palpitations, cough, shortness of breath, GI complaints, numbness, tingling, skin rash/lesions or other concerns.  PHYSICAL EXAM: BP 112/82  Pulse 68  Ht 5\' 6"  (1.676 m)  Wt 216 lb (97.977 kg)  BMI 34.86 kg/m2 Well developed, pleasant female in no distress Neck: no lymphadenopathy, thyromegaly or carotid bruit Heart: regular rate and rhythm Lungs: clear bilaterally Abdomen: soft, nontender, no organomegaly or mass Extremities: no edema, 2+ pulses Back: no spine or CVA tenderness. Joints without effusion, warmth Skin: no lesions, rashes Psych: normal mood, affect, hygiene and grooming Neuro: alert and oriented, normal strength, sensation , gait and cranial nerves grossly intact  Lab Results  Component Value Date   HGBA1C 6.5* 03/18/2012   Lab Results  Component Value Date   CHOL 189  03/18/2012   HDL 52 03/18/2012   LDLCALC 161* 03/18/2012   TRIG 96 03/18/2012   CHOLHDL 3.6 03/18/2012   Lab Results  Component Value Date   ALT 21 03/18/2012   AST 18 03/18/2012   ALKPHOS 61 03/18/2012   BILITOT 0.5 03/18/2012   Fasting sugar 117  ASSESSMENT/PLAN: 1. Type II or unspecified type diabetes mellitus without mention of complication, not stated as uncontrolled    2. Pure hypercholesterolemia  rosuvastatin (CRESTOR) 10 MG tablet  3. Essential hypertension, benign     . Hyperlipidemia--not at goal. Very surprising that LDL didn't drop below 100 with the change that was made.  Reviewed low cholesterol diet. Not tolerating lipitor due to joint pains which started upon changing statin.  Also, not reaching goal of LDL<100 on current dose.  Stop the Lipitor for 1-2 weeks to see if symptoms improve.  If so, then try Crestor instead.   If symptoms don't improve after 2-4 weeks off the lipitor, then may not be due to Lipitor, but due to arthritis or other cause.Marland Kitchen  DM--doing well overall.  Needs to resume exercise when able. Continue current medications. Schedule annual diabetic eye exam (this will be her first)  OV 6 months,  Labs only 3 months--but make visit if having problems or side effects, letter if at goal.

## 2012-03-25 NOTE — Patient Instructions (Signed)
Stop the Lipitor for 1-2 weeks to see if pain symptoms improve.  If so, then try Crestor instead.    If symptoms don't improve after 2-4 weeks off the lipitor, then may not be due to Lipitor, but due to arthritis or other cause, and then we need to decide if we should increase the Lipitor dose to reach goals, vs trying Crestor anyway. (will depend on cost).  Please schedule your diabetic eye exam (needs to be done once a year).

## 2012-05-18 ENCOUNTER — Other Ambulatory Visit: Payer: Self-pay | Admitting: Family Medicine

## 2012-05-18 ENCOUNTER — Telehealth: Payer: Self-pay | Admitting: Family Medicine

## 2012-05-18 DIAGNOSIS — E7800 Pure hypercholesterolemia, unspecified: Secondary | ICD-10-CM

## 2012-05-18 MED ORDER — ROSUVASTATIN CALCIUM 10 MG PO TABS: 10.0000 mg | ORAL_TABLET | Freq: Every day | ORAL | Status: DC

## 2012-05-18 NOTE — Telephone Encounter (Signed)
Advise pt that rx was sent to pharmacy.  She is due for labs (ordered in system) next month--please schedule lab visit

## 2012-05-19 NOTE — Telephone Encounter (Signed)
Spoke with patient and let her know that rx was called in, she scheduled lab visit for 06/24/12.

## 2012-06-24 ENCOUNTER — Other Ambulatory Visit: Payer: BC Managed Care – PPO

## 2012-06-24 ENCOUNTER — Encounter: Payer: Self-pay | Admitting: Medical

## 2012-06-24 ENCOUNTER — Ambulatory Visit (INDEPENDENT_AMBULATORY_CARE_PROVIDER_SITE_OTHER): Payer: BC Managed Care – PPO | Admitting: Medical

## 2012-06-24 VITALS — BP 100/70 | HR 92 | Temp 99.0°F | Resp 12 | Wt 215.0 lb

## 2012-06-24 DIAGNOSIS — E7800 Pure hypercholesterolemia, unspecified: Secondary | ICD-10-CM

## 2012-06-24 DIAGNOSIS — B029 Zoster without complications: Secondary | ICD-10-CM

## 2012-06-24 LAB — LIPID PANEL: Cholesterol: 175 mg/dL (ref 0–200)

## 2012-06-24 LAB — HEPATIC FUNCTION PANEL
ALT: 21 U/L (ref 0–35)
AST: 20 U/L (ref 0–37)
Bilirubin, Direct: 0.1 mg/dL (ref 0.0–0.3)
Indirect Bilirubin: 0.5 mg/dL (ref 0.0–0.9)

## 2012-06-24 MED ORDER — VALACYCLOVIR HCL 1 G PO TABS: 1000.0000 mg | ORAL_TABLET | Freq: Three times a day (TID) | ORAL | Status: DC

## 2012-06-24 NOTE — Progress Notes (Signed)
Subjective:   HPI  Taylor Jones is a 61 y.o. female who presents for possible shingles.  Been feeling discomfort around right shoulder blade for a few weeks, tingling, scratchy, though it was bra, but then started getting rash 4-5 days ago along this same area.  Is very rad, blistery, in a patch, starting to spread.  Denies fever, NVD, no joint aches, no recent illness.  Is under stress, but not more than usual.  No prior shingles.  Thinks she had chicken pox as a child, but not sure.  No recent contacts with infected persons.  No other aggravating or relieving factors.  Tried neosporin topical which helped a little.  No recent URI symptoms, no abdominal or back pain, no GU symptoms.  No other c/o.  The following portions of the patient's history were reviewed and updated as appropriate: allergies, current medications, past family history, past medical history, past social history, past surgical history and problem list.  Past Medical History  Diagnosis Date  . Hypertension   . Hyperlipidemia   . Impaired fasting glucose   . Osteoarthritis     of hands and knees  . Irregular menses   . Postmenopausal   . Internal hemorrhoids   . External hemorrhoids   . Diverticulosis   . Atypical endometrial hyperplasia     s/p TAH-BSO.  had bleeding from granulation tissue (requiring treatment with silver nitrate, eventually resolved)  . Diabetes mellitus     No Known Allergies   Review of Systems ROS reviewed and was negative other than noted in HPI or above.    Objective:   Physical Exam  General appearance: alert, no distress, WD/WN Skin: right lateral chest wall and right posterior chest wall in about T6-T7 dermatome with 2 patches of erythema with early possible vesicles forming, suggestive of shingles Neck: supple, no lymphadenopathy, no thyromegaly, no masses Heart: RRR, normal S1, S2, no murmurs Lungs: CTA bilaterally, no wheezes, rhonchi, or rales Abdomen: +bs, soft, non tender, non  distended, no masses, no hepatomegaly, no splenomegaly  Assessment and Plan :     Encounter Diagnosis  Name Primary?  . Shingles Yes   Discussed diagnosis, treatment, precautions.  Begin Valtrex, can use OTC Ibuprofen or Aleve the next week or so, and if worsening, call or return.   discussed usual course of illness, possibility of recurrence, discussed possible complications.

## 2012-06-24 NOTE — Addendum Note (Signed)
Addended by: Barbette Or A on: 06/24/2012 11:25 AM   Modules accepted: Orders

## 2012-06-24 NOTE — Patient Instructions (Signed)

## 2012-06-30 ENCOUNTER — Other Ambulatory Visit: Payer: Self-pay | Admitting: *Deleted

## 2012-06-30 DIAGNOSIS — E7800 Pure hypercholesterolemia, unspecified: Secondary | ICD-10-CM

## 2012-06-30 MED ORDER — ROSUVASTATIN CALCIUM 10 MG PO TABS: 10.0000 mg | ORAL_TABLET | Freq: Every day | ORAL | Status: DC

## 2012-09-27 ENCOUNTER — Encounter: Payer: Self-pay | Admitting: Family Medicine

## 2012-09-27 ENCOUNTER — Ambulatory Visit (INDEPENDENT_AMBULATORY_CARE_PROVIDER_SITE_OTHER): Payer: BC Managed Care – PPO | Admitting: Family Medicine

## 2012-09-27 VITALS — BP 128/82 | HR 68 | Ht 66.0 in | Wt 224.0 lb

## 2012-09-27 DIAGNOSIS — E7800 Pure hypercholesterolemia, unspecified: Secondary | ICD-10-CM

## 2012-09-27 DIAGNOSIS — I10 Essential (primary) hypertension: Secondary | ICD-10-CM

## 2012-09-27 DIAGNOSIS — Z23 Encounter for immunization: Secondary | ICD-10-CM

## 2012-09-27 DIAGNOSIS — E119 Type 2 diabetes mellitus without complications: Secondary | ICD-10-CM

## 2012-09-27 DIAGNOSIS — R635 Abnormal weight gain: Secondary | ICD-10-CM

## 2012-09-27 MED ORDER — METFORMIN HCL ER 500 MG PO TB24: 1000.0000 mg | ORAL_TABLET | Freq: Every day | ORAL | Status: DC

## 2012-09-27 MED ORDER — ROSUVASTATIN CALCIUM 10 MG PO TABS: 10.0000 mg | ORAL_TABLET | Freq: Every day | ORAL | Status: DC

## 2012-09-27 NOTE — Progress Notes (Signed)
Chief Complaint  Patient presents with  . Diabetes    nonfasting 6 month follow up.   Diabetes follow-up:  Blood sugars at home are running 120.  Denies hypoglycemia.  Denies polydipsia and polyuria.  Still has yet to schedule her eye exam.  Patient follows a low sugar diet and checks feet regularly without concerns.  30 minute walk 5x/week, plus being active at work (not sedentary).  She admits to "stress eating"--financial stressors with her company.  Hypertension follow-up:  Blood pressures aren't checked elsewhere.  Denies dizziness, headaches, chest pain.  Denies side effects of medications.  Hyperlipidemia follow-up:  Patient is reportedly following a low-fat, low cholesterol diet.  Compliant with medications and denies medication side effects.  She has been taking Crestor 10mg  without side effects, and last lipids (November) were at goal.  Tolerating the Crestor much better than the other statins, denies myalgias.  She apparently needs to get authorization from her insurance, and is out of meds as of today.  Feels warm all the time, only occasionally having mild night sweats.  She feels like she is warmer than she normally has been.  Denies weight loss, hair/skin changes.  Past Medical History  Diagnosis Date  . Hypertension   . Hyperlipidemia   . Impaired fasting glucose   . Osteoarthritis     of hands and knees  . Irregular menses   . Postmenopausal   . Internal hemorrhoids   . External hemorrhoids   . Diverticulosis   . Atypical endometrial hyperplasia     s/p TAH-BSO.  had bleeding from granulation tissue (requiring treatment with silver nitrate, eventually resolved)  . Diabetes mellitus    Past Surgical History  Procedure Laterality Date  . Cesarean section      x3  . Total abdominal hysterectomy w/ bilateral salpingoophorectomy  01/03/2005  . Tubal ligation    . Knee arthroscopy  01-04-08    R knee, meniscal tear, Dr. Darrelyn Hillock   History   Social History  . Marital Status:  Married    Spouse Name: N/A    Number of Children: 3  . Years of Education: N/A   Occupational History  . Not on file.   Social History Main Topics  . Smoking status: Never Smoker   . Smokeless tobacco: Never Used  . Alcohol Use: Yes     Comment: Maybe one drink per week.  . Drug Use: No  . Sexually Active: Yes -- Female partner(s)   Other Topics Concern  . Not on file   Social History Narrative   Lives with husband.  Children live in [place] and [place].  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 1500/1200, Disp: , Rfl: ;  metFORMIN (GLUCOPHAGE-XR) 500 MG 24 hr tablet, Take 1,000 mg by mouth daily with breakfast., Disp: , Rfl:  Multiple Vitamins-Minerals (MULTIVITAMIN WITH MINERALS) tablet, Take 1 tablet by mouth daily.  , Disp: , Rfl: ;  rosuvastatin (CRESTOR) 10 MG tablet, Take 1 tablet (10 mg total) by mouth daily., Disp: 30 tablet, Rfl: 2  No Known Allergies  ROS: +weight gain.  Denies fatigue, headaches, dizziness, chest pain, shortness of breath, edema.  No fevers, URI symptoms, vomiting, diarrhea, or other concerns.+stress.  Sleeping ok.  No postherpetic pain/neuralgia (had shingles recently).  See HPI  PHYSICAL EXAM: BP 128/82  Pulse 68  Ht 5\' 6"  (1.676 m)  Wt 224 lb (101.606 kg)  BMI 36.17 kg/m2 Well developed, pleasant female in no distress Neck: no lymphadenopathy, thyromegaly, carotid bruit or mass. Heart: regular rate and rhythm without murmur Lungs: clear bilaterally Abdomen: soft, nontender, no mass, no organomegaly Extremities: Trace pretibial edema. 2+ pulse.  Normal sensation Skin: no rash, bruising Psych: normal mood, affect, hygiene and grooming Neuro: alert and oriented.  Normal gait, strength, cranial nerves  intact  Lab Results  Component Value Date   HGBA1C 6.6 09/27/2012   ASSESSMENT/PLAN:  1. Type II or unspecified type diabetes mellitus without mention of complication, not stated as uncontrolled  HgB A1c   HgB A1c   metFORMIN (GLUCOPHAGE-XR) 500 MG 24 hr tablet   Microalbumin/Creatinine Ratio, Urine  2. Essential hypertension, benign    3. Pure hypercholesterolemia  rosuvastatin (CRESTOR) 10 MG tablet   rosuvastatin (CRESTOR) 10 MG tablet  4. Need for prophylactic vaccination and inoculation against influenza  Flu vaccine greater than or equal to 3yo preservative free IM   Flu vaccine greater than or equal to 3yo preservative free IM   Urine microalbumin today. Prefers to hold off on TSH until fasting labs in May In May--lipid, c-met, TSH, A1c prior to visit (doing A1c earlier than 6 months so we can get back on a 6 month schedule for all labs.)  Advised to schedule diabetic eye exam (and why!) Discussed weight gain, diet, stress reduction techniques, healthy snacks.  Continue regular exercise; needs to lose weight.  Flu shot given today.  Advised to get in the fall in future Shingles vaccine--consider next year (recently had shingles)  25 minute visit, more than 1/2 spent counseling

## 2012-09-27 NOTE — Patient Instructions (Addendum)
Continue to exercise daily.  Consider increasing to 45-60 minutes to help with stress reduction and weight loss.  Try and come up with other stress reduction techniques to help relax you when stressed (rather than eating).  Please choose healthy snacks, and watch your portion control.  Please try and lose the weight that you have regained.  Please schedule your diabetic eye exam

## 2012-09-28 ENCOUNTER — Encounter: Payer: Self-pay | Admitting: Family Medicine

## 2012-10-04 ENCOUNTER — Telehealth: Payer: Self-pay | Admitting: Family Medicine

## 2012-10-06 ENCOUNTER — Telehealth: Payer: Self-pay | Admitting: Family Medicine

## 2012-10-06 NOTE — Telephone Encounter (Signed)
LM

## 2012-11-30 ENCOUNTER — Other Ambulatory Visit: Payer: Self-pay | Admitting: Family Medicine

## 2012-12-30 ENCOUNTER — Other Ambulatory Visit: Payer: BC Managed Care – PPO

## 2012-12-30 DIAGNOSIS — R635 Abnormal weight gain: Secondary | ICD-10-CM

## 2012-12-30 DIAGNOSIS — E7800 Pure hypercholesterolemia, unspecified: Secondary | ICD-10-CM

## 2012-12-30 DIAGNOSIS — I10 Essential (primary) hypertension: Secondary | ICD-10-CM

## 2012-12-30 DIAGNOSIS — E119 Type 2 diabetes mellitus without complications: Secondary | ICD-10-CM

## 2012-12-30 LAB — COMPREHENSIVE METABOLIC PANEL
Albumin: 4.4 g/dL (ref 3.5–5.2)
CO2: 27 mEq/L (ref 19–32)
Chloride: 105 mEq/L (ref 96–112)
Glucose, Bld: 114 mg/dL — ABNORMAL HIGH (ref 70–99)
Potassium: 4.3 mEq/L (ref 3.5–5.3)
Sodium: 141 mEq/L (ref 135–145)
Total Protein: 7.2 g/dL (ref 6.0–8.3)

## 2012-12-30 LAB — LIPID PANEL
HDL: 51 mg/dL (ref >39)
LDL Cholesterol: 128 mg/dL — ABNORMAL HIGH (ref 0–99)
Triglycerides: 110 mg/dL (ref <150)
VLDL: 22 mg/dL (ref 0–40)

## 2013-01-06 ENCOUNTER — Encounter: Payer: Self-pay | Admitting: Family Medicine

## 2013-01-06 ENCOUNTER — Ambulatory Visit (INDEPENDENT_AMBULATORY_CARE_PROVIDER_SITE_OTHER): Payer: BC Managed Care – PPO | Admitting: Family Medicine

## 2013-01-06 VITALS — BP 130/84 | HR 60 | Ht 66.0 in | Wt 217.0 lb

## 2013-01-06 DIAGNOSIS — E119 Type 2 diabetes mellitus without complications: Secondary | ICD-10-CM

## 2013-01-06 DIAGNOSIS — E7800 Pure hypercholesterolemia, unspecified: Secondary | ICD-10-CM

## 2013-01-06 DIAGNOSIS — I10 Essential (primary) hypertension: Secondary | ICD-10-CM

## 2013-01-06 MED ORDER — PRAVASTATIN SODIUM 40 MG PO TABS: 40.0000 mg | ORAL_TABLET | Freq: Every day | ORAL | Status: DC

## 2013-01-06 NOTE — Patient Instructions (Signed)
Start the pravastatin in place of the Crestor.  Call us if you're having problems tolerating the medication.   Return for labs in 2-3 months to make sure it is strong enough (if not, dose will be increased).  Continue metformin; continue efforts at weight loss (diet, increase exercise).  Check with your insurance company re: coverage/cost of zostavax (shingles vaccine).  We will give it at your physical in November.

## 2013-01-06 NOTE — Progress Notes (Signed)
Chief Complaint  Patient presents with  . Diabetes    3 month med check, labs done 12/30/12.   Diabetes follow-up:  Blood sugars at home are running 120's on average, checking about 3x/week.  Denies hypoglycemia.  Denies polydipsia and polyuria.  Last eye exam was last week.  Patient follows a low sugar diet and checks feet regularly without concerns.  Walking 30 minutes on the treadmill 4x/week.  Hypertension follow-up:  Blood pressures are not checked elsewhere.  Denies dizziness, headaches, chest pain, edema.  Denies side effects of medications.  Hyperlipidemia follow-up:  Patient is reportedly following a low-fat, low cholesterol diet.  She stopped taking the Crestor about 6 weeks ago due to cost.  Prior to [employer] she took lipitor, but didn't tolerate it due to joint aches.  Recalls being on simvastatin in the past, but changed because she wasn't at goal.  Past Medical History  Diagnosis Date  . Hypertension   . Hyperlipidemia   . Impaired fasting glucose   . Osteoarthritis     of hands and knees  . Irregular menses   . Postmenopausal   . Internal hemorrhoids   . External hemorrhoids   . Diverticulosis   . Atypical endometrial hyperplasia     s/p TAH-BSO.  had bleeding from granulation tissue (requiring treatment with silver nitrate, eventually resolved)  . Diabetes mellitus    Past Surgical History  Procedure Laterality Date  . Cesarean section      x3  . Total abdominal hysterectomy w/ bilateral salpingoophorectomy  January 20, 2005  . Tubal ligation    . Knee arthroscopy  01-21-2008    R knee, meniscal tear, Dr. Darrelyn Hillock   History   Social History  . Marital Status: Married    Spouse Name: N/A    Number of Children: 3  . Years of Education: N/A   Occupational History  . Not on file.   Social History Main Topics  . Smoking status: Never Smoker   . Smokeless tobacco: Never Used  . Alcohol Use: Yes     Comment: Maybe one drink per week.  . Drug Use: No  . Sexually Active: Yes --  Female partner(s)   Other Topics Concern  . Not on file   Social History Narrative   Lives with husband.  Children live in [place] and [place].  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 Diabetes Mother     diet controlled  . Hypertension Mother   . Hyperlipidemia Mother   . Dementia Mother   . Heart attack Father   . Hyperlipidemia Father   . Hypertension Sister   . Hyperlipidemia Sister   . Osteoarthritis Sister   . Heart attack Brother   . Hyperlipidemia Brother   . Diabetes Maternal Aunt   . Diabetes Maternal Grandmother     Current outpatient prescriptions:amLODipine (NORVASC) 10 MG tablet, TAKE ONE TABLET BY MOUTH EVERY DAY, Disp: 90 tablet, Rfl: 0;  Glucosamine-Chondroit-Vit C-Mn (GLUCOSAMINE-CHONDROITIN) CAPS, Take 1 capsule by mouth daily. 1500/1200, Disp: , Rfl: ;  metFORMIN (GLUCOPHAGE-XR) 500 MG 24 hr tablet, Take 2 tablets (1,000 mg total) by mouth daily with breakfast., Disp: 180 tablet, Rfl: 2 Multiple Vitamins-Minerals (MULTIVITAMIN WITH MINERALS) tablet, Take 1 tablet by mouth daily.  , Disp: , Rfl: ;  rosuvastatin (CRESTOR) 10 MG tablet, Take 1 tablet (10 mg total) by mouth daily., Disp: 30 tablet, Rfl: 2 (NOT taking Crestor x  6 weeks)  No Known Allergies  ROS:  Denies fevers, URI or allergy symptoms, cough, shortness of breath, chest pain, palpitations, headaches, dizziness, edema, urinary complaints, joint pains, mood are okay.  Denies bleeding/bruising, skin rashes or other skin lesions.  PHYSICAL EXAM: BP 130/84  Pulse 60  Ht 5\' 6"  (1.676 m)  Wt 217 lb (98.431 kg)  BMI 35.04 kg/m2 Well developed, pleasant female in no distress  Neck: no lymphadenopathy, thyromegaly, carotid bruit or mass.  Heart: regular rate and rhythm without murmur  Lungs: clear bilaterally  Abdomen: soft, nontender, no mass, no organomegaly  Extremities: Trace pretibial edema. 2+ pulse. Normal sensation  Skin: no  rash, bruising  Psych: normal mood, affect, hygiene and grooming  Neuro: alert and oriented. Normal gait, strength, cranial nerves intact  ASSESSMENT/PLAN:  Type II or unspecified type diabetes mellitus without mention of complication, not stated as uncontrolled  Pure hypercholesterolemia - Plan: pravastatin (PRAVACHOL) 40 MG tablet  Essential hypertension, benign   Diabetes is well controlled.  Further weight loss is encouraged.  Increase exercise, watch food choices/portion control. Hyperlipidemia--reviewed goals.  Unable to afford Crestor; intolerant of Lipitor.  Simvastatin dose was limited due to use of amlodipine.  Start pravastatin 40mg .  Recheck labs in 2-3 months.  If LDL still >100 then increase dose to 80mg .  F/u 2-3 months for lab visit only (LFT's and lipids).  6 months--CPE/med check zostavax and pneumovax at physical (check insurance re: zostavax)

## 2013-02-14 ENCOUNTER — Encounter: Payer: Self-pay | Admitting: Family Medicine

## 2013-02-14 ENCOUNTER — Ambulatory Visit (INDEPENDENT_AMBULATORY_CARE_PROVIDER_SITE_OTHER): Payer: BC Managed Care – PPO | Admitting: Family Medicine

## 2013-02-14 VITALS — BP 120/82 | HR 68 | Ht 66.0 in | Wt 221.0 lb

## 2013-02-14 DIAGNOSIS — M25569 Pain in unspecified knee: Secondary | ICD-10-CM

## 2013-02-14 DIAGNOSIS — M25561 Pain in right knee: Secondary | ICD-10-CM

## 2013-02-14 MED ORDER — NAPROXEN 500 MG PO TABS: 500.0000 mg | ORAL_TABLET | Freq: Two times a day (BID) | ORAL | Status: DC

## 2013-02-14 NOTE — Patient Instructions (Addendum)
Avoid kneeling. Take naproxen twice daily with food for at least 1 week (and up to 2 weeks).  If increasing knee pain, swelling, locking or giving way, you should either follow up here or with your orthopedist.

## 2013-02-14 NOTE — Progress Notes (Signed)
Chief Complaint  Patient presents with  . Knee Pain    right knee pain x 1 week. Unsure of what happened. Comes and goes, very sharp pain. Had a torn meniscus repaired by Dr.Gioffre in 01/01/2008.    Gradual onset of knee pain with walking x 1 week.  Pain is medial, sharp and knife-like.  Pain is intermittent, seems to start out of the blue.  Denies any giving way (although pain is sometimes so sharp that she feels like it might).  Denies any locking of the knee.  Denies any swelling.  Kneeling a little more than usual (quilting, with it being on the floor), otherwise no change in activity or shoes.  Barefoot at home, wears 3 different pairs of shoes at work, and occurs with any shoes, and while barefoot.  Pain lasts until she is able to sit and rest, take the weight off her leg.  Can last for up to 3 hours, while she is on it at work.  Currently pain-free, sitting in exam room, but was bothering her all day.  No pain sitting or sleeping.  Hasn't taken any pain meds.  Tried icing it but it didn't seem to help.    Past Medical History  Diagnosis Date  . Hypertension   . Hyperlipidemia   . Impaired fasting glucose   . Osteoarthritis     of hands and knees  . Irregular menses   . Postmenopausal   . Internal hemorrhoids   . External hemorrhoids   . Diverticulosis   . Atypical endometrial hyperplasia     s/p TAH-BSO.  had bleeding from granulation tissue (requiring treatment with silver nitrate, eventually resolved)  . Diabetes mellitus    Past Surgical History  Procedure Laterality Date  . Cesarean section      x3  . Total abdominal hysterectomy w/ bilateral salpingoophorectomy  12-31-2004  . Tubal ligation    . Knee arthroscopy  2008/01/01    R knee, meniscal tear, Dr. Darrelyn Hillock   History   Social History  . Marital Status: Married    Spouse Name: N/A    Number of Children: 3  . Years of Education: N/A   Occupational History  . Not on file.   Social History Main Topics  . Smoking status: Never  Smoker   . Smokeless tobacco: Never Used  . Alcohol Use: Yes     Comment: Maybe one drink per week.  . Drug Use: No  . Sexually Active: Yes -- Female partner(s)   Other Topics Concern  . Not on file   Social History Narrative   Lives with husband.  Children live in [place] and [place].  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 amLODipine (NORVASC) 10 MG tablet TAKE ONE TABLET BY MOUTH EVERY DAY  90 tablet  0  . Glucosamine-Chondroit-Vit C-Mn (GLUCOSAMINE-CHONDROITIN) CAPS Take 1 capsule by mouth daily. 1500/1200      . metFORMIN (GLUCOPHAGE-XR) 500 MG 24 hr tablet Take 2 tablets (1,000 mg total) by mouth daily with breakfast.  180 tablet  2  . Multiple Vitamins-Minerals (MULTIVITAMIN WITH MINERALS) tablet Take 1 tablet by mouth daily.        . pravastatin (PRAVACHOL) 40 MG tablet Take 1 tablet (40 mg total) by mouth daily.  30 tablet  2   No current facility-administered medications on file prior to visit.   No Known  Allergies  ROS:  Denies fevers, headaches, dizziness, chest pain, edema, swelling, bleeding/bruising, GI complaints or other concerns.  PHYSICAL EXAM: BP 120/82  Pulse 68  Ht 5\' 6"  (1.676 m)  Wt 221 lb (100.245 kg)  BMI 35.69 kg/m2 Well developed, pleasant female in no distress.  Currently pain-free Examination of knees:  No effusion, warmth or tenderness.  No ligamentous laxity or pain noted on exam.  Negative Lachman, McMurray, drawer. (area of pain is medial, not reproducible today)  ASSESSMENT/PLAN: Right knee pain - Plan: naproxen (NAPROSYN) 500 MG tablet Naprosyn x 7-10 days.  Risks/side effects and precautions reviewed Unclear etiology, likely strain vs tendonitis.  No evidence of internal derangement on exam today.  If pain persists, worsens, f/u either here or with Ortho for re-eval.

## 2013-02-15 ENCOUNTER — Encounter: Payer: Self-pay | Admitting: Family Medicine

## 2013-03-09 ENCOUNTER — Encounter: Payer: Self-pay | Admitting: Family Medicine

## 2013-03-09 ENCOUNTER — Ambulatory Visit (INDEPENDENT_AMBULATORY_CARE_PROVIDER_SITE_OTHER): Payer: BC Managed Care – PPO | Admitting: Family Medicine

## 2013-03-09 VITALS — BP 120/72 | HR 84 | Ht 66.0 in | Wt 223.0 lb

## 2013-03-09 DIAGNOSIS — D239 Other benign neoplasm of skin, unspecified: Secondary | ICD-10-CM

## 2013-03-09 DIAGNOSIS — D229 Melanocytic nevi, unspecified: Secondary | ICD-10-CM

## 2013-03-09 NOTE — Progress Notes (Signed)
Chief Complaint  Patient presents with  . Nevus    two moles on her left arm that she believes to be changing in appearance.    Mole on her left upper arm was noted to be red in color last week.  Denies being tender, irritated, sore.  Denies injury, trauma.  There is also another area on her left forearm that has been there for a while, but seems a little bigger.  Denies any h/o skin cancers. No h/o melanoma in family.  She presents for eval of mole, given concern over change in color.  Past Medical History  Diagnosis Date  . Hypertension   . Hyperlipidemia   . Impaired fasting glucose   . Osteoarthritis     of hands and knees  . Irregular menses   . Postmenopausal   . Internal hemorrhoids   . External hemorrhoids   . Diverticulosis   . Atypical endometrial hyperplasia     s/p TAH-BSO.  had bleeding from granulation tissue (requiring treatment with silver nitrate, eventually resolved)  . Diabetes mellitus    Past Surgical History  Procedure Laterality Date  . Cesarean section      x3  . Total abdominal hysterectomy w/ bilateral salpingoophorectomy  2005/01/10  . Tubal ligation    . Knee arthroscopy  Jan 11, 2008    R knee, meniscal tear, Dr. Darrelyn Hillock   History   Social History  . Marital Status: Married    Spouse Name: N/A    Number of Children: 3  . Years of Education: N/A   Occupational History  . Not on file.   Social History Main Topics  . Smoking status: Never Smoker   . Smokeless tobacco: Never Used  . Alcohol Use: Yes     Comment: Maybe one drink per week.  . Drug Use: No  . Sexually Active: Yes -- Female partner(s)   Other Topics Concern  . Not on file   Social History Narrative   Lives with husband.  Children live in [place] and [place].  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 amLODipine (NORVASC) 10 MG tablet TAKE ONE TABLET BY MOUTH EVERY DAY  90 tablet   0  . Glucosamine-Chondroit-Vit C-Mn (GLUCOSAMINE-CHONDROITIN) CAPS Take 1 capsule by mouth daily. 1500/1200      . metFORMIN (GLUCOPHAGE-XR) 500 MG 24 hr tablet Take 2 tablets (1,000 mg total) by mouth daily with breakfast.  180 tablet  2  . Multiple Vitamins-Minerals (MULTIVITAMIN WITH MINERALS) tablet Take 1 tablet by mouth daily.        . pravastatin (PRAVACHOL) 40 MG tablet Take 1 tablet (40 mg total) by mouth daily.  30 tablet  2   No current facility-administered medications on file prior to visit.   No Known Allergies  ROS:  No bleeding, bruising, changes in meds, GI complaints, headaches, fevers, pruritis or other concerns.  PHYSICAL EXAM: BP 120/72  Pulse 84  Ht 5\' 6"  (1.676 m)  Wt 223 lb (101.152 kg)  BMI 36.01 kg/m2 Well developed, pleasant female in no distress Skin:L upper arm/shoulder: 6.5-7 mm x 5mm width slightly pink lesion.  There are 2 round, slightly raised lesions within this area that are next to each other, with a flat pink area above it, connecting them in a somewhat rounded total appearance. Picture of this raised area was drawn for pt (she couldn't really see it well),  and encouraged her to take a picture.  L upper forearm: 5-6 mm vaguely hyperpigmented, minimally raised papule.    ASSESSMENT/PLAN:  Change in skin moles  Return as scheduled for next med check, and will recheck these areas at next visit.  Return sooner if any change is noticed.  Take picture of the mole on upper arm, to make it easier to follow and better determine if there is any change in color/shape/size.  Return sooner than next scheduled appointment if you continue to see any changes (size, shape, color, bleeding, borders)  ABCDE's of melanoma were reviewed, as well as brief discussion of BCC and SCC.  All questions were answered.

## 2013-03-09 NOTE — Patient Instructions (Addendum)
Take picture of the mole on upper arm.  Return sooner than next scheduled appointment if you continue to see any changes (size, shape, color, bleeding, borders)  Melanoma Melanoma is the least common, but most dangerous, form of skin cancer. This is because it can spread (metastasize) to other organs and can be life-threatening. Melanoma is a cancerous (malignant) tumor that begins in a certain type of cells, called melanocytes. Melanocytes are the cells that produce the color (pigment) called melanin. Melanin colors our skin, hair, eyes, and moles. CAUSES  The exact cause of melanoma is unknown. You may have a higher risk if you:  Spend or have spent a lot of time in the sun. This includes sunlamp and tanning booth exposure.  Have had sunburns. This put you at a particularly increased risk for melanoma. The more blistering sunburns a person has, the higher the risk.  Spend time in parts of the world with more intense sunlight.  Have fair skin that does not tan easily. You may have a lower risk if you have a darker skin color. However, people with darker skin can get melanoma, especially on the hands and feet (acral areas).  Have a close relative (parent, sibling) who has melanoma.  Have a large number of skin moles (more than 100). SYMPTOMS  A skin mole is suspicious if it has any of these 5 traits. This is called the ABCDE's of melanoma:  Asymmetry: Irregular shape, not simply round or oval.  Border: Edge of the mole is irregular, not smooth.  Color: Mole may have multiple colors in it, including brown, black, blue, red, or tan.  Diameter: More than 0.2 inches (6 mm) across.  Evolving: Any unusual change or symptoms in the mole, such as pain, itching, stinging, sensitivity, or bleeding. A mole that is noticeably changing in appearance, or any new mole, should be checked for melanoma. In general, people develop new moles until age 52. New moles after this age should be brought to the  attention of your caregiver. DIAGNOSIS  Your caregiver can look at your skin and find lesions or moles that may be suspicious. A patient may also notice a mole with symptoms or a mole that does not look like most of the other moles on his or her body. This is called the "ugly duckling" sign. A tissue sample (biopsy) examined under a microscope is needed to determine if it is melanoma. The size and extent of the biopsy will depend on the location, size, and appearance of the skin lesion or mole. The biopsy can also reveal whether melanoma has spread to deeper layers of the skin. TREATMENT  Surgery to completely remove the melanoma is required. Lymph nodes may also be removed. If the melanoma has spread to other organs, such as the liver, lungs, bone, or brain, cancer-fighting drugs (chemotherapy) must be used. Your caregiver will discuss your treatment options with you. You can ask about being included in a clinical trial to evaluate new forms of treatment. Melanoma can occasionally recur years after the initial diagnosis. If you have melanoma, you will need follow-up visits with your caregiver for many years. PREVENTION  Risk for melanoma can be reduced by minimizing sun exposure. Practice the 3 S's:  Slip on a shirt.  Slop on sunscreen.  Slap on a hat. Do not spend time in the sun during peak midafternoon hours. Sunscreen/sunblock with SPF 30 or higher and UVA/UVB block should be applied regularly. You should do this even during brief exposure  to sunlight. You should also do this on cloudy days and in winter, even though the perceived sunlight is less. Always avoid sunburn! Wear sunglasses that block UV light. Be sure to see your caregiver if you have any new or changing moles. HOME CARE INSTRUCTIONS   Follow wound care instructions after surgical removal of your melanoma.  Practice good sun avoidance and protective measures as described above.  Let your close family members (parents, children,  siblings) know about your diagnosis. This puts them at a higher risk of getting melanoma than the general population. SEEK MEDICAL CARE IF:   You notice any new moles, or you have any moles that are changing.  You have had a melanoma removed and you notice a new growth near the same location.  You have had a melanoma removed and you experience any new or unexplained health problems. Document Released: 08/04/2005 Document Revised: 10/27/2011 Document Reviewed: 11/23/2009 [facility] Patient Information 2014 [place], [place].

## 2013-03-10 ENCOUNTER — Other Ambulatory Visit: Payer: Self-pay | Admitting: Family Medicine

## 2013-04-07 ENCOUNTER — Other Ambulatory Visit: Payer: BC Managed Care – PPO

## 2013-04-08 ENCOUNTER — Other Ambulatory Visit: Payer: BC Managed Care – PPO

## 2013-04-08 DIAGNOSIS — R899 Unspecified abnormal finding in specimens from other organs, systems and tissues: Secondary | ICD-10-CM

## 2013-04-09 LAB — HEPATIC FUNCTION PANEL
ALT: 22 U/L (ref 0–35)
Albumin: 4.3 g/dL (ref 3.5–5.2)
Alkaline Phosphatase: 58 U/L (ref 39–117)
Total Protein: 7.4 g/dL (ref 6.0–8.3)

## 2013-04-09 LAB — LIPID PANEL: HDL: 57 mg/dL (ref >39)

## 2013-04-11 ENCOUNTER — Telehealth: Payer: Self-pay | Admitting: *Deleted

## 2013-04-11 NOTE — Telephone Encounter (Signed)
Spoke with patient and she stated that she is having side effects from the pravastatin 40mg . She said when she was in 02/14/13 complaining of knee pain she didn't realize that it was probably due to the medication-she had this problem in the past. What do you recommend, she does not want to be on this medication any longer, she states she is really suffering. Please advise. Thanks.

## 2013-04-11 NOTE — Telephone Encounter (Signed)
Patient advised, she will call in 3 weeks and let Dr.Knapp know how she is doing.

## 2013-04-11 NOTE — Telephone Encounter (Signed)
Left message for patient to return my call to go over lab results. 

## 2013-04-11 NOTE — Telephone Encounter (Signed)
Have her stop the med for 2-3 weeks. If knee pain completely resolves, then we need to decide which other lipid-lowering med to start. If knee pain persists, then likely it isn't from the statin (maybe arthritis or another cause).  I believe she has tried multiple statins, so if we need to change, I would avoid statins.  I would recommend trial of Welchol, given that she also has diabetes.  If her knee pain resolves off the statin (in a few weeks) then call for rx and samples/vouchers of the Welchol powder (vs make OV to discuss in more detail if she has questions)

## 2013-06-13 ENCOUNTER — Telehealth: Payer: Self-pay | Admitting: *Deleted

## 2013-06-13 NOTE — Telephone Encounter (Signed)
noted 

## 2013-06-13 NOTE — Telephone Encounter (Signed)
Just an FYI, pt left message on my VM in reference to telephone call from 03/2013 re: knee pain and statin medication. After stopping the statin her pain did completely resolve. She called for a new/different non-statin rx. According to your recommendation to try Welchol I gave her #9 samples and rx savings card, she will call if tolerated and I can call in rx for her.

## 2013-06-29 ENCOUNTER — Other Ambulatory Visit: Payer: Self-pay | Admitting: *Deleted

## 2013-06-29 MED ORDER — COLESEVELAM HCL 3.75 G PO PACK: 1.0000 | PACK | Freq: Every day | ORAL | Status: AC

## 2013-06-30 ENCOUNTER — Telehealth: Payer: Self-pay | Admitting: *Deleted

## 2013-06-30 NOTE — Telephone Encounter (Signed)
Patient called and cannot afford to pay for the [facility], (I called in yesterday she was tolerating the samples fine) the rx is $362 dollars WITH the discount card I gave her. She will be in to see you next week, states she will need something different as she cannot afford this.

## 2013-06-30 NOTE — Telephone Encounter (Signed)
??  how can that be WITH the discount card?  Does she have a high deductible medication plan?  I would think the discount card would still decrease cost by a lot more.  Check what type of insurance, and call the drug rep to get more info re: why this is so expensive for her?  I believe she changed from pravastatin to this due to joint pains.  Would prefer to discuss other options at visit, but work on finding out why this is so expensive for her first (because it is a good choice to use, and she is tolerating it).  Do we need to get prior auth, maybe??

## 2013-07-06 ENCOUNTER — Telehealth: Payer: Self-pay | Admitting: *Deleted

## 2013-07-06 NOTE — Telephone Encounter (Signed)
Drug rep came in to speak with me and assured me that Welchol should be a tier-3 under this patient's plan, it may be a high deductible such as $95 but with the discount card it will be no more than $25. Rep asked me to have patient call her insurance and see what is going on. She will call me back once she speaks with her insurance and I will take it from there.

## 2013-07-07 ENCOUNTER — Ambulatory Visit (INDEPENDENT_AMBULATORY_CARE_PROVIDER_SITE_OTHER): Payer: BC Managed Care – PPO | Admitting: Family Medicine

## 2013-07-07 ENCOUNTER — Encounter: Payer: Self-pay | Admitting: Family Medicine

## 2013-07-07 VITALS — BP 112/82 | HR 72 | Ht 66.0 in | Wt 215.0 lb

## 2013-07-07 DIAGNOSIS — Z23 Encounter for immunization: Secondary | ICD-10-CM

## 2013-07-07 DIAGNOSIS — Z0000 Encounter for general adult medical examination without abnormal findings: Secondary | ICD-10-CM

## 2013-07-07 DIAGNOSIS — E559 Vitamin D deficiency, unspecified: Secondary | ICD-10-CM

## 2013-07-07 DIAGNOSIS — E119 Type 2 diabetes mellitus without complications: Secondary | ICD-10-CM

## 2013-07-07 DIAGNOSIS — E7800 Pure hypercholesterolemia, unspecified: Secondary | ICD-10-CM

## 2013-07-07 DIAGNOSIS — E669 Obesity, unspecified: Secondary | ICD-10-CM

## 2013-07-07 DIAGNOSIS — I10 Essential (primary) hypertension: Secondary | ICD-10-CM

## 2013-07-07 LAB — BASIC METABOLIC PANEL
CO2: 30 mEq/L (ref 19–32)
Chloride: 103 mEq/L (ref 96–112)
Creat: 0.82 mg/dL (ref 0.50–1.10)
Glucose, Bld: 111 mg/dL — ABNORMAL HIGH (ref 70–99)
Potassium: 4.6 mEq/L (ref 3.5–5.3)
Sodium: 140 mEq/L (ref 135–145)

## 2013-07-07 LAB — POCT URINALYSIS DIPSTICK
Bilirubin, UA: NEGATIVE
Glucose, UA: NEGATIVE
Ketones, UA: NEGATIVE
pH, UA: 5

## 2013-07-07 LAB — POCT GLYCOSYLATED HEMOGLOBIN (HGB A1C): Hemoglobin A1C: 6.4

## 2013-07-07 MED ORDER — METFORMIN HCL ER 500 MG PO TB24: 1000.0000 mg | ORAL_TABLET | Freq: Every day | ORAL | Status: DC

## 2013-07-07 MED ORDER — AMLODIPINE BESYLATE 10 MG PO TABS: ORAL_TABLET | ORAL | Status: DC

## 2013-07-07 NOTE — Progress Notes (Signed)
Chief Complaint  Patient presents with  . Annual Exam    fasting annual exam with pelvic. UA showed 1+ leuks, no symptoms. Did not do eye exam as she just one done with Dr.Orman. No concerns today.    Taylor Jones is a 62 y.o. female who presents for a complete physical.  She has the following concerns:  Diabetes follow-up:  Blood sugars are not checked much. Denies hypoglycemia.  Denies polydipsia and polyuria.  Last eye exam was 1-2 months ago.  Patient follows a low sugar diet and checks feet regularly without concerns.  She hasn't been getting regular exercise.  Hypertension follow-up:  Blood pressures are not checked elsewhere. Denies dizziness, headaches, chest pain, edema.  Denies side effects of medications.  Hyperlipidemia follow-up:  Patient is reportedly following a low-fat, low cholesterol diet.  Was unable to tolerate multiple statins--pain in her knees recurred after a trial of being off--could hardly walk by the end of the day.  She tolerated Welchol samples, but has a high deductible and wasn't able to get the rx filled.  Immunization History  Administered Date(s) Administered  . Influenza Split 09/01/2011  . Influenza, Seasonal, Injecte, Preservative Fre 09/27/2012  . Influenza,inj,Quad PF,36+ Mos 07/07/2013  . Tdap 05/10/2007   Last Pap smear: can't recall (9/08 per records); s/p hysterectomy (benign) Last mammogram: approx 01-13-05 (never got last year) Last colonoscopy: 1/06  Last DEXA: never  Dentist: twice yearly  Ophtho: yearly Exercise: no regular exercise (she was limited by knee pain from statins; now resolved off medication)  Past Medical History  Diagnosis Date  . Hypertension   . Hyperlipidemia   . Impaired fasting glucose   . Osteoarthritis     of hands and knees  . Irregular menses   . Postmenopausal   . Internal hemorrhoids   . External hemorrhoids   . Diverticulosis   . Atypical endometrial hyperplasia     s/p TAH-BSO.  had bleeding from  granulation tissue (requiring treatment with silver nitrate, eventually resolved)  . Diabetes mellitus     Past Surgical History  Procedure Laterality Date  . Cesarean section      x3  . Total abdominal hysterectomy w/ bilateral salpingoophorectomy  2005-01-13  . Tubal ligation    . Knee arthroscopy Right 2008-01-14    R knee, meniscal tear, Dr. Darrelyn Hillock    History   Social History  . Marital Status: Married    Spouse Name: N/A    Number of Children: 3  . Years of Education: N/A   Occupational History  . associate at [employer]    Social History Main Topics  . Smoking status: Never Smoker   . Smokeless tobacco: Never Used  . Alcohol Use: Yes     Comment: Maybe one drink per week.  . Drug Use: No  . Sexual Activity: Yes    Partners: Male   Other Topics Concern  . Not on file   Social History Narrative   Lives with husband.  Children live in [place] and [place].  She was caregiver for mother who passed away (fall 2010-01-13).  Works as Tax adviser at [employer] History  Problem Relation Age of Onset  . Diabetes Mother     diet controlled  . Hypertension Mother   . Hyperlipidemia Mother   . Dementia Mother   . Heart attack Father   . Hyperlipidemia Father   . Hypertension Sister   . Hyperlipidemia Sister   . Osteoarthritis Sister   .  Heart attack Brother   . Hyperlipidemia Brother   . Diabetes Maternal Aunt   . Diabetes Maternal Grandmother   . Breast cancer Neg Hx   . Colon cancer Neg Hx     Current outpatient prescriptions:amLODipine (NORVASC) 10 MG tablet, TAKE ONE TABLET BY MOUTH ONCE DAILY, Disp: 90 tablet, Rfl: 3;  Glucosamine-Chondroit-Vit C-Mn (GLUCOSAMINE-CHONDROITIN) CAPS, Take 1 capsule by mouth daily. 1500/1200, Disp: , Rfl: ;  metFORMIN (GLUCOPHAGE-XR) 500 MG 24 hr tablet, Take 2 tablets (1,000 mg total) by mouth daily with breakfast., Disp: 180 tablet, Rfl: 1 Multiple Vitamins-Minerals (MULTIVITAMIN WITH MINERALS) tablet, Take 1 tablet by mouth daily.  ,  Disp: , Rfl: ;  Colesevelam HCl (WELCHOL) 3.75 G PACK, Take 1 packet by mouth daily., Disp: 30 each, Rfl: 0  No Known Allergies  ROS: The patient denies anorexia, fever, headaches, vision changes, ear pain, sore throat, breast concerns, chest pain, palpitations, dizziness, syncope, dyspnea on exertion, cough, swelling, nausea, vomiting, constipation, abdominal pain, melena, hematuria, incontinence, dysuria, vaginal bleeding, discharge, odor or itch, genital lesions, joint pains, numbness, tingling, weakness, tremor, suspicious skin lesions, depression, anxiety, abnormal bleeding/bruising, or enlarged lymph nodes. Arthritis in hands, feet, knees.  Moods are okay, sleeping okay. Occasional heartburn (was worse with Welchol--tolerated it best taken in the evening). Some diarrhea this morning, which irritated her hemorrhoids, noted some bleeding today  PHYSICAL EXAM: BP 112/82  Pulse 72  Ht 5\' 6"  (1.676 m)  Wt 215 lb (97.523 kg)  BMI 34.72 kg/m2   General Appearance:  Alert, cooperative, no distress, appears stated age   Head:  Normocephalic, without obvious abnormality, atraumatic   Eyes:  PERRL, conjunctiva/corneas clear, EOM's intact, fundi  benign   Ears:  Normal TM's and external ear canals   Nose:  Nares normal, mucosa normal, no drainage or sinus tenderness   Throat:  Lips, mucosa, and tongue normal; teeth and gums normal   Neck:  Supple, no lymphadenopathy; thyroid: no enlargement/tenderness/nodules; no carotid  bruit or JVD   Back:  Spine nontender, no curvature, ROM normal, no CVA tenderness   Lungs:  Clear to auscultation bilaterally without wheezes, rales or ronchi; respirations unlabored   Chest Wall:  No tenderness or deformity   Heart:  Regular rate and rhythm, S1 and S2 normal, no murmur, rub  or gallop   Breast Exam:  No tenderness, masses, or nipple discharge or inversion. No axillary lymphadenopathy   Abdomen:  Soft, non-tender, nondistended, normoactive bowel sounds,   no masses, no hepatosplenomegaly   Genitalia:  Normal external genitalia without lesions. BUS and vagina normal, mild atrophic changes. No vaginal discharge. Bimanual exam normal--no mass or adnexal tenderness. Uterus surgically absent.   Rectal:  Normal tone, no masses or tenderness; guaiac positive soft brown stool (minimally inflamed hemorrhoid, bled this morning per pt, after loose stool)   Extremities:  No clubbing, cyanosis or edema   Pulses:  2+ and symmetric all extremities   Skin:  Skin color, texture, turgor normal, no rashes or lesions; prev note reviewed--moles seen on L upper and forearm are no longer inflamed.  Multiple areas of slightly pigmented macules on skin diffusely. No areas of concern  Lymph nodes:  Cervical, supraclavicular, and axillary nodes normal   Neurologic:  CNII-XII intact, normal strength, sensation and gait; reflexes 2+ and symmetric throughout          Psych: Normal mood, affect, hygiene and grooming.  1+ leuks in urine.  Denies any urinary complaints  ASSESSMENT/PLAN:  Routine general medical  examination at a health care facility - Plan: POCT Urinalysis Dipstick  Type II or unspecified type diabetes mellitus without mention of complication, not stated as uncontrolled - controlled - Plan: HgB A1c, Basic metabolic panel, Microalbumin / creatinine urine ratio, HM Diabetes Foot Exam, metFORMIN (GLUCOPHAGE-XR) 500 MG 24 hr tablet  Need for prophylactic vaccination and inoculation against influenza - Plan: Flu Vaccine QUAD 36+ mos IM  Essential hypertension, benign - controlled - Plan: Basic metabolic panel, amLODipine (NORVASC) 10 MG tablet  Pure hypercholesterolemia - intolerant of statin.  Tolerated Welchol (powder) but unable to afford.  will [facility] at [facility]. Also discussed Zetia as another option, check cost  Unspecified vitamin D deficiency - Plan: Vit D  25 hydroxy (rtn osteoporosis monitoring)  Need for prophylactic vaccination against Streptococcus  pneumoniae (pneumococcus) - Plan: Pneumococcal conjugate vaccine 13-valent  Obesity (BMI 30-39.9) - encouraged daily exercise and weight loss  Discussed monthly self breast exams and yearly mammograms--counseled extensively, and strongly encouraged to schedule mammogram; at least 30 minutes of aerobic activity at least 5 days/week; proper sunscreen use reviewed; healthy diet, including goals of calcium and vitamin D intake and alcohol recommendations (less than or equal to 1 drink/day) reviewed; regular seatbelt use; changing batteries in smoke detectors.  Immunization recommendations discussed--flu shot and Prevnar 13 today.  Pneumovax at age 81.  Colonoscopy recommendations reviewed.  Hemoccult cards given, to do when hemorrhoids are not inflamed

## 2013-07-07 NOTE — Patient Instructions (Addendum)
HEALTH MAINTENANCE RECOMMENDATIONS:  It is recommended that you get at least 30 minutes of aerobic exercise at least 5 days/week (for weight loss, you may need as much as 60-90 minutes). This can be any activity that gets your heart rate up. This can be divided in 10-15 minute intervals if needed, but try and build up your endurance at least once a week.  Weight bearing exercise is also recommended twice weekly.  Eat a healthy diet with lots of vegetables, fruits and fiber.  "Colorful" foods have a lot of vitamins (ie green vegetables, tomatoes, red peppers, etc).  Limit sweet tea, regular sodas and alcoholic beverages, all of which has a lot of calories and sugar.  Up to 1 alcoholic drink daily may be beneficial for women (unless trying to lose weight, watch sugars).  Drink a lot of water.  Calcium recommendations are 1200-1500 mg daily (1500 mg for postmenopausal women or women without ovaries), and vitamin D 1000 IU daily.  This should be obtained from diet and/or supplements (vitamins), and calcium should not be taken all at once, but in divided doses.  Monthly self breast exams and yearly mammograms for women over the age of 29 is recommended.  Sunscreen of at least SPF 30 should be used on all sun-exposed parts of the skin when outside between the hours of 10 am and 4 pm (not just when at beach or pool, but even with exercise, golf, tennis, and yard work!)  Use a sunscreen that says "broad spectrum" so it covers both UVA and UVB rays, and make sure to reapply every 1-2 hours.  Remember to change the batteries in your smoke detectors when changing your clock times in the spring and fall.  Use your seat belt every time you are in a car, and please drive safely and not be distracted with cell phones and texting while driving.  Check the prices of Welchol (powder for once daily, or tablets for 6 tabs/day) and of Zetia 10mg  daily.  Check at [facility]  We have samples and savings cards for Zetia also.   Let us know which you would like to try--we have more samples of both, and that way hopefully one can be affordable to start in January.  Check if zostavax (shingles vaccine) is covered.  If so, you can get at your next visit, or schedule nurse visit in a month (must be a month apart from other vaccines)  [facility] the [facility] and schedule your screening mammogram!  Send in the hemoccult cards once your stools are back to normal, no hemorrhoids flaring.

## 2013-07-08 ENCOUNTER — Encounter: Payer: Self-pay | Admitting: Family Medicine

## 2013-07-08 LAB — MICROALBUMIN / CREATININE URINE RATIO
Creatinine, Urine: 25.5 mg/dL
Microalb Creat Ratio: 19.6 mg/g (ref 0.0–30.0)
Microalb, Ur: 0.5 mg/dL (ref 0.00–1.89)

## 2014-01-04 ENCOUNTER — Encounter: Payer: Self-pay | Admitting: Family Medicine

## 2014-01-04 ENCOUNTER — Ambulatory Visit (INDEPENDENT_AMBULATORY_CARE_PROVIDER_SITE_OTHER): Payer: Self-pay | Admitting: Family Medicine

## 2014-01-04 VITALS — BP 112/74 | HR 88 | Ht 66.0 in | Wt 216.0 lb

## 2014-01-04 DIAGNOSIS — E669 Obesity, unspecified: Secondary | ICD-10-CM

## 2014-01-04 DIAGNOSIS — E7800 Pure hypercholesterolemia, unspecified: Secondary | ICD-10-CM

## 2014-01-04 DIAGNOSIS — E119 Type 2 diabetes mellitus without complications: Secondary | ICD-10-CM

## 2014-01-04 DIAGNOSIS — R197 Diarrhea, unspecified: Secondary | ICD-10-CM

## 2014-01-04 DIAGNOSIS — I10 Essential (primary) hypertension: Secondary | ICD-10-CM

## 2014-01-04 LAB — POCT GLYCOSYLATED HEMOGLOBIN (HGB A1C): Hemoglobin A1C: 6.4

## 2014-01-04 MED ORDER — METFORMIN HCL ER 500 MG PO TB24: 1000.0000 mg | ORAL_TABLET | Freq: Every day | ORAL | Status: DC

## 2014-01-04 NOTE — Patient Instructions (Signed)
Continue your current medications. Ensure that you are getting at least 30 minutes of exercise daily. Try and lose weight. Pay attention to the foods that might be triggering your pain/diarrhea.  Cut back on raw vegetables, beans.  See if there is a correlation with dairy (you might consider a 1-2 week lactose-free diet).  Consider use of probiotics (Activia or tablet form such as Electronics engineer).  It is recommended that you start Welchol to get your lipids under goal (LDL<100).  Let us know if you would like to try samples, to see which form (packets or tablets) you tolerate the best, and then contact the company to see if you are eligible for patient assistance programs they offer.

## 2014-01-04 NOTE — Progress Notes (Signed)
Chief Complaint  Patient presents with  . Hypertension    fasting med check.  . Diabetes   She is currently without insurance, as it was costing too much (more than her house payment!) Husband is self-employed, and she is only working part-time so no benefits.  Hyperlipidemia: She feels so much better since being off statins (head feels clearer, no muscle pains).She admits that she never started the [facility], and really doesn't want to.  Diabetes:  Blood sugars are running 110-115.  Denies polydipsia, polyuria.  Last eye exam was 6 months ago.  Checking feet without concerns.  Hypertension:  Doesn't check BP elsewhere.  Denies headaches, dizziness, chest pain, palpitations, shortness of breath, edema  3 bouts in the last 6 weeks of epigastric pain (which radiates throughout entire abdomen) and diarrhea. Sudden onset, not related to meals.  No different/new foods, but later realized she has been eating a lot of raw broccoli.    Past Medical History  Diagnosis Date  . Hypertension   . Hyperlipidemia   . Impaired fasting glucose   . Osteoarthritis     of hands and knees  . Irregular menses   . Postmenopausal   . Internal hemorrhoids   . External hemorrhoids   . Diverticulosis   . Atypical endometrial hyperplasia     s/p TAH-BSO.  had bleeding from granulation tissue (requiring treatment with silver nitrate, eventually resolved)  . Diabetes mellitus    Past Surgical History  Procedure Laterality Date  . Cesarean section      x3  . Total abdominal hysterectomy w/ bilateral salpingoophorectomy  27-Nov-2004  . Tubal ligation    . Knee arthroscopy Right 28-Nov-2007    R knee, meniscal tear, Dr. Gladstone Lighter   History   Social History  . Marital Status: Married    Spouse Name: N/A    Number of Children: 3  . Years of Education: N/A   Occupational History  . 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 Smoking status: Never Smoker   . Smokeless tobacco: Never Used  . Alcohol  Use: Yes     Comment: Maybe one drink per week.  . Drug Use: No  . Sexual Activity: Yes    Partners: Male   Other Topics Concern  . Not on file   Social History Narrative   Lives with husband.  Children live in [place] and [place].  She was caregiver for mother who passed away (fall November 27, 2009).  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 amLODipine (NORVASC) 10 MG tablet TAKE ONE TABLET BY MOUTH ONCE DAILY   . Glucosamine-Chondroit-Vit C-Mn (GLUCOSAMINE-CHONDROITIN) CAPS Take 1 capsule by mouth daily. 1500/1200   . metFORMIN (GLUCOPHAGE-XR) 500 MG 24 hr tablet Take 2 tablets (1,000 mg total) by mouth daily with breakfast.   . Multiple Vitamins-Minerals (MULTIVITAMIN WITH MINERALS) tablet Take 1 tablet by mouth daily.     . [DISCONTINUED] metFORMIN (GLUCOPHAGE-XR) 500 MG 24 hr tablet Take 2 tablets (1,000 mg total) by mouth daily with breakfast.   . Colesevelam HCl (WELCHOL) 3.75 G PACK Take 1 packet by mouth daily. 01/04/2014: Not taking due to cost; she never started it   No Known Allergies  ROS:  Denies fevers, chills, headaches, dizziness, chest pain, palpitations, edema, URI symptoms, cough, shortness of breath, nausea, vomiting.  +abdominal pain/diarrhea as per HPI.  No urinary complaints.  No joint pains or myalgias.  Moods are good.  PHYSICAL EXAM: BP 112/74  Pulse 88  Ht 5\' 6"  (1.676 m)  Wt 216 lb (97.977 kg)  BMI 34.88 kg/m2  Well developed, pleasant female in no distress  Neck: no lymphadenopathy, thyromegaly, carotid bruit or mass.  Heart: regular rate and rhythm without murmur  Lungs: clear bilaterally  Abdomen: soft, nontender, no mass, no organomegaly  Extremities: no edema. 2+ pulse. Normal sensation  Skin: no rash, bruising  Psych: normal mood, affect, hygiene and grooming  Neuro: alert and oriented. Normal gait, strength, cranial nerves intact    Lab Results  Component Value Date   HGBA1C 6.4 01/04/2014    ASSESSMENT/PLAN:  Type II or unspecified type diabetes mellitus without mention of complication, not stated as uncontrolled - controlled - Plan: HgB A1c, metFORMIN (GLUCOPHAGE-XR) 500 MG 24 hr tablet  Pure hypercholesterolemia - off meds; intolerant of statins; not willing to start other meds at this point, so won't even check (paying out of pocket; would not start med if high)  Essential hypertension, benign - controlled  Obesity (BMI 30-39.9) - weight loss encouraged  Diarrhea - and abdominal pain.  likely related to gas, possibly related to diet (gas-producing foods).  counseled re: diet   Reviewed her cholesterol values in the past, goals, current guidelines/recommendations  Elects to hold off on rechecking lipids (likely not at goal, is without insurance, and is hesitant to start any meds at this point). Offered welchol samples, and for pt to contact company for patient assitance program (if tolerates samples). She is not inclined to want to do this at this point.  Risks of hyperlipidemia reviewed.  We will discuss again at subsequent visit, sooner if she changes her mind.  DM--well controlled  Diarrhea and abdominal pain--possibly related to gas. Eats a lot of raw vegetables, beans.  Counseled re: diet, consider lactose intolerance.  Trial of probiotics

## 2014-01-16 ENCOUNTER — Other Ambulatory Visit: Payer: Self-pay | Admitting: Family Medicine

## 2014-06-19 ENCOUNTER — Encounter: Payer: Self-pay | Admitting: Family Medicine

## 2014-07-18 ENCOUNTER — Telehealth: Payer: Self-pay | Admitting: Internal Medicine

## 2014-07-18 DIAGNOSIS — E119 Type 2 diabetes mellitus without complications: Secondary | ICD-10-CM

## 2014-07-18 NOTE — Telephone Encounter (Signed)
Pt had a physical 12/2 but had to cancel due to her boss making her work since they are short staffed. Pt has about a weeks worth left of metformin and amlodipine and needs a refill to get her to her next physical appt on 10/12/14. Send to [facility]

## 2014-07-19 ENCOUNTER — Encounter: Payer: Self-pay | Admitting: Family Medicine

## 2014-07-19 MED ORDER — AMLODIPINE BESYLATE 10 MG PO TABS: 10.0000 mg | ORAL_TABLET | Freq: Every day | ORAL | Status: DC

## 2014-07-19 MED ORDER — METFORMIN HCL ER 500 MG PO TB24: 1000.0000 mg | ORAL_TABLET | Freq: Every day | ORAL | Status: DC

## 2014-08-20 ENCOUNTER — Other Ambulatory Visit: Payer: Self-pay | Admitting: Family Medicine

## 2014-08-23 ENCOUNTER — Ambulatory Visit (INDEPENDENT_AMBULATORY_CARE_PROVIDER_SITE_OTHER): Payer: Self-pay | Admitting: Family Medicine

## 2014-08-23 ENCOUNTER — Encounter: Payer: Self-pay | Admitting: Family Medicine

## 2014-08-23 VITALS — BP 136/82 | HR 100 | Ht 66.0 in | Wt 224.0 lb

## 2014-08-23 DIAGNOSIS — E119 Type 2 diabetes mellitus without complications: Secondary | ICD-10-CM

## 2014-08-23 DIAGNOSIS — E785 Hyperlipidemia, unspecified: Secondary | ICD-10-CM

## 2014-08-23 DIAGNOSIS — I10 Essential (primary) hypertension: Secondary | ICD-10-CM

## 2014-08-23 LAB — POCT UA - MICROALBUMIN
Albumin/Creatinine Ratio, Urine, POC: 8.6
CREATININE, POC: 63.6 mg/dL
Microalbumin Ur, POC: 5.5 mg/L

## 2014-08-23 LAB — POCT GLYCOSYLATED HEMOGLOBIN (HGB A1C): HEMOGLOBIN A1C: 7.8

## 2014-08-23 MED ORDER — METFORMIN HCL ER 500 MG PO TB24: 1500.0000 mg | ORAL_TABLET | Freq: Every day | ORAL | Status: DC

## 2014-08-23 MED ORDER — LISINOPRIL 10 MG PO TABS: 10.0000 mg | ORAL_TABLET | Freq: Every day | ORAL | Status: DC

## 2014-08-23 MED ORDER — AMLODIPINE BESYLATE 10 MG PO TABS: 10.0000 mg | ORAL_TABLET | Freq: Every day | ORAL | Status: DC

## 2014-08-23 NOTE — Patient Instructions (Addendum)
Your hemoglobin A1c was elevated at 7.8 today.  Goal is <7.  Increase metformin to 1500mg  daily (3 tablets--can take them all at once, but if it bothers your stomach, you can separate them).  Get new monitor, and start checking regularly.  If/when sugars are back down (<125 in the mornings, preferably lower) than can consider cutting back to just 1000mg  each day, as long as sugars remain low (bump back up to 1500mg  daily if sugars are higher on the lower dose). Daily exercise will help keep the sugars down, and so will lowering the weight.  Add lisinopril 10mg  once daily to your 10mg  of amlodipine. Check blood pressure regularly.  Goal is <130/80, but acceptable up to 135/85.  If/when BP drops lower than 110-115/60-65, then cut the amlodipine in 1/2.  If it doesn't drop below 130/80, continue the amlodipine at the full tablet.    Let's get you on a GOOD diet--low in sodium and low in cholesterol.  We will plan on checking your cholesterol in another couple of months on a GOOD diet.  Need to see if LDL is close to goal, or if a medication is truly warranted, and if so, we might need to look into patient assistance options from the pharmaceutical companies.  We can try you on samples first.  The medications I'm considering are Zetia and Welchol (powder vs pills)  Low-Sodium Eating Plan Sodium raises blood pressure and causes water to be held in the body. Getting less sodium from food will help lower your blood pressure, reduce any swelling, and protect your heart, liver, and kidneys. We get sodium by adding salt (sodium chloride) to food. Most of our sodium comes from canned, boxed, and frozen foods. Restaurant foods, fast foods, and pizza are also very high in sodium. Even if you take medicine to lower your blood pressure or to reduce fluid in your body, getting less sodium from your food is important. WHAT IS MY PLAN? Most people should limit their sodium intake to 2,300 mg a day. Your health care provider  recommends that you limit your sodium intake to __________ a day.  WHAT DO I NEED TO KNOW ABOUT THIS EATING PLAN? For the low-sodium eating plan, you will follow these general guidelines:  Choose foods with a % Daily Value for sodium of less than 5% (as listed on the food label).   Use salt-free seasonings or herbs instead of table salt or sea salt.   Check with your health care provider or pharmacist before using salt substitutes.   Eat fresh foods.  Eat more vegetables and fruits.  Limit canned vegetables. If you do use them, rinse them well to decrease the sodium.   Limit cheese to 1 oz (28 g) per day.   Eat lower-sodium products, often labeled as "lower sodium" or "no salt added."  Avoid foods that contain monosodium glutamate (MSG). MSG is sometimes added to [place] food and some canned foods.  Check food labels (Nutrition Facts labels) on foods to learn how much sodium is in one serving.  Eat more home-cooked food and less restaurant, buffet, and fast food.  When eating at a restaurant, ask that your food be prepared with less salt or none, if possible.  HOW DO I READ FOOD LABELS FOR SODIUM INFORMATION? The Nutrition Facts label lists the amount of sodium in one serving of the food. If you eat more than one serving, you must multiply the listed amount of sodium by the number of servings. Food  labels may also identify foods as:  Sodium free--Less than 5 mg in a serving.  Very low sodium--35 mg or less in a serving.  Low sodium--140 mg or less in a serving.  Light in sodium--50% less sodium in a serving. For example, if a food that usually has 300 mg of sodium is changed to become light in sodium, it will have 150 mg of sodium.  Reduced sodium--25% less sodium in a serving. For example, if a food that usually has 400 mg of sodium is changed to reduced sodium, it will have 300 mg of sodium. WHAT FOODS CAN I EAT? Grains Low-sodium cereals, including oats, puffed  wheat and rice, and shredded wheat cereals. Low-sodium crackers. Unsalted rice and pasta. Lower-sodium bread.  Vegetables Frozen or fresh vegetables. Low-sodium or reduced-sodium canned vegetables. Low-sodium or reduced-sodium tomato sauce and paste. Low-sodium or reduced-sodium tomato and vegetable juices.  Fruits Fresh, frozen, and canned fruit. Fruit juice.  Meat and Other Protein Products Low-sodium canned tuna and salmon. Fresh or frozen meat, poultry, seafood, and fish. Lamb. Unsalted nuts. Dried beans, peas, and lentils without added salt. Unsalted canned beans. Homemade soups without salt. Eggs.  Dairy Milk. Soy milk. Ricotta cheese. Low-sodium or reduced-sodium cheeses. Yogurt.  Condiments Fresh and dried herbs and spices. Salt-free seasonings. Onion and garlic powders. Low-sodium varieties of mustard and ketchup. Lemon juice.  Fats and Oils Reduced-sodium salad dressings. Unsalted butter.  Other Unsalted popcorn and pretzels.  The items listed above may not be a complete list of recommended foods or beverages. Contact your dietitian for more options. WHAT FOODS ARE NOT RECOMMENDED? Grains Instant hot cereals. Bread stuffing, pancake, and biscuit mixes. Croutons. Seasoned rice or pasta mixes. Noodle soup cups. Boxed or frozen macaroni and cheese. Self-rising flour. Regular salted crackers. Vegetables Regular canned vegetables. Regular canned tomato sauce and paste. Regular tomato and vegetable juices. Frozen vegetables in sauces. Salted french fries. Olives. Angie Fava. Relishes. Sauerkraut. Salsa. Meat and Other Protein Products Salted, canned, smoked, spiced, or pickled meats, seafood, or fish. Bacon, ham, sausage, hot dogs, corned beef, chipped beef, and packaged luncheon meats. Salt pork. Jerky. Pickled herring. Anchovies, regular canned tuna, and sardines. Salted nuts. Dairy Processed cheese and cheese spreads. Cheese curds. Blue cheese and cottage cheese. Buttermilk.    Condiments Onion and garlic salt, seasoned salt, table salt, and sea salt. Canned and packaged gravies. Worcestershire sauce. Tartar sauce. Barbecue sauce. Teriyaki sauce. Soy sauce, including reduced sodium. Steak sauce. Fish sauce. Oyster sauce. Cocktail sauce. Horseradish. Regular ketchup and mustard. Meat flavorings and tenderizers. Bouillon cubes. Hot sauce. Tabasco sauce. Marinades. Taco seasonings. Relishes. Fats and Oils Regular salad dressings. Salted butter. Margarine. Ghee. Bacon fat.  Other Potato and tortilla chips. Corn chips and puffs. Salted popcorn and pretzels. Canned or dried soups. Pizza. Frozen entrees and pot pies.  The items listed above may not be a complete list of foods and beverages to avoid. Contact your dietitian for more information. Document Released: 01/24/2002 Document Revised: 08/09/2013 Document Reviewed: 06/08/2013 [facility] Patient Information 2015 [place], [place]. This information is not intended to replace advice given to you by your health care provider. Make sure you discuss any questions you have with your health care provider.  Fat and Cholesterol Control Diet Fat and cholesterol levels in your blood and organs are influenced by your diet. High levels of fat and cholesterol may lead to diseases of the heart, small and large blood vessels, gallbladder, liver, and pancreas. CONTROLLING FAT AND CHOLESTEROL WITH DIET Although exercise  and lifestyle factors are important, your diet is key. That is because certain foods are known to raise cholesterol and others to lower it. The goal is to balance foods for their effect on cholesterol and more importantly, to replace saturated and trans fat with other types of fat, such as monounsaturated fat, polyunsaturated fat, and omega-3 fatty acids. On average, a person should consume no more than 15 to 17 g of saturated fat daily. Saturated and trans fats are considered "bad" fats, and they will raise LDL cholesterol.  Saturated fats are primarily found in animal products such as meats, butter, and cream. However, that does not mean you need to give up all your favorite foods. Today, there are good tasting, low-fat, low-cholesterol substitutes for most of the things you like to eat. Choose low-fat or nonfat alternatives. Choose round or loin cuts of red meat. These types of cuts are lowest in fat and cholesterol. Chicken (without the skin), fish, veal, and ground [place] breast are great choices. Eliminate fatty meats, such as hot dogs and salami. Even shellfish have little or no saturated fat. Have a 3 oz (85 g) portion when you eat lean meat, poultry, or fish. Trans fats are also called "partially hydrogenated oils." They are oils that have been scientifically manipulated so that they are solid at room temperature resulting in a longer shelf life and improved taste and texture of foods in which they are added. Trans fats are found in stick margarine, some tub margarines, cookies, crackers, and baked goods.  When baking and cooking, oils are a great substitute for butter. The monounsaturated oils are especially beneficial since it is believed they lower LDL and raise HDL. The oils you should avoid entirely are saturated tropical oils, such as coconut and palm.  Remember to eat a lot from food groups that are naturally free of saturated and trans fat, including fish, fruit, vegetables, beans, grains (barley, rice, couscous, bulgur wheat), and pasta (without cream sauces).  IDENTIFYING FOODS THAT LOWER FAT AND CHOLESTEROL  Soluble fiber may lower your cholesterol. This type of fiber is found in fruits such as apples, vegetables such as broccoli, potatoes, and carrots, legumes such as beans, peas, and lentils, and grains such as barley. Foods fortified with plant sterols (phytosterol) may also lower cholesterol. You should eat at least 2 g per day of these foods for a cholesterol lowering effect.  Read package labels to  identify low-saturated fats, trans fat free, and low-fat foods at the supermarket. Select cheeses that have only 2 to 3 g saturated fat per ounce. Use a heart-healthy tub margarine that is free of trans fats or partially hydrogenated oil. When buying baked goods (cookies, crackers), avoid partially hydrogenated oils. Breads and muffins should be made from whole grains (whole-wheat or whole oat flour, instead of "flour" or "enriched flour"). Buy non-creamy canned soups with reduced salt and no added fats.  FOOD PREPARATION TECHNIQUES  Never deep-fry. If you must fry, either stir-fry, which uses very little fat, or use non-stick cooking sprays. When possible, broil, bake, or roast meats, and steam vegetables. Instead of putting butter or margarine on vegetables, use lemon and herbs, applesauce, and cinnamon (for squash and sweet potatoes). Use nonfat yogurt, salsa, and low-fat dressings for salads.  LOW-SATURATED FAT / LOW-FAT FOOD SUBSTITUTES Meats / Saturated Fat (g)  Avoid: Steak, marbled (3 oz/85 g) / 11 g  Choose: Steak, lean (3 oz/85 g) / 4 g  Avoid: Hamburger (3 oz/85 g) / 7 g  Choose: Hamburger, lean (3 oz/85 g) / 5 g  Avoid: Ham (3 oz/85 g) / 6 g  Choose: Ham, lean cut (3 oz/85 g) / 2.4 g  Avoid: Chicken, with skin, dark meat (3 oz/85 g) / 4 g  Choose: Chicken, skin removed, dark meat (3 oz/85 g) / 2 g  Avoid: Chicken, with skin, light meat (3 oz/85 g) / 2.5 g  Choose: Chicken, skin removed, light meat (3 oz/85 g) / 1 g Dairy / Saturated Fat (g)  Avoid: Whole milk (1 cup) / 5 g  Choose: Low-fat milk, 2% (1 cup) / 3 g  Choose: Low-fat milk, 1% (1 cup) / 1.5 g  Choose: Skim milk (1 cup) / 0.3 g  Avoid: Hard cheese (1 oz/28 g) / 6 g  Choose: Skim milk cheese (1 oz/28 g) / 2 to 3 g  Avoid: Cottage cheese, 4% fat (1 cup) / 6.5 g  Choose: Low-fat cottage cheese, 1% fat (1 cup) / 1.5 g  Avoid: Ice cream (1 cup) / 9 g  Choose: Sherbet (1 cup) / 2.5 g  Choose: Nonfat  frozen yogurt (1 cup) / 0.3 g  Choose: Frozen fruit bar / trace  Avoid: Whipped cream (1 tbs) / 3.5 g  Choose: Nondairy whipped topping (1 tbs) / 1 g Condiments / Saturated Fat (g)  Avoid: Mayonnaise (1 tbs) / 2 g  Choose: Low-fat mayonnaise (1 tbs) / 1 g  Avoid: Butter (1 tbs) / 7 g  Choose: Extra light margarine (1 tbs) / 1 g  Avoid: Coconut oil (1 tbs) / 11.8 g  Choose: Olive oil (1 tbs) / 1.8 g  Choose: Corn oil (1 tbs) / 1.7 g  Choose: Safflower oil (1 tbs) / 1.2 g  Choose: Sunflower oil (1 tbs) / 1.4 g  Choose: Soybean oil (1 tbs) / 2.4 g  Choose: Canola oil (1 tbs) / 1 g Document Released: 08/04/2005 Document Revised: 11/29/2012 Document Reviewed: 11/02/2013 [employer] Patient Information 2015 [place], [place]. This information is not intended to replace advice given to you by your health care provider. Make sure you discuss any questions you have with your health care provider.

## 2014-08-23 NOTE — Progress Notes (Signed)
Chief Complaint  Patient presents with  . Diabetes    nonfasting med check.    Diabetes:  She hasn't been checking her sugars since her glucometer broke.  Denies polydipsia, polyuria. Checking feet without concerns. Admits to some dietary noncompliance over the holidays.  She has gained 8 pounds since her last visit, but reports this was since Thanksgiving, related to holidays.  Eye exam is within the year, and is scheduled for her next one (next month).  Denies vision problems; denies numbness, tingling.  Hypertension: Doesn't check BP elsewhere. Denies headaches, dizziness, chest pain, palpitations, shortness of breath, edema.  Denies high sodium intake (makes her own soup/stock).  Hyperlipidemia--head was much clearer and she felt much better after stopping statins. She tried three different statins. She was never willing to try Welchol.  She has been careful with her diet, other than since Thanksgiving. She has been eating more cheese.  Having some constipation related to dietary changes (eating more cheese over the holidays). No longer having loose stools or bloating.  PMH, PSH, SH reviewed.  Outpatient Encounter Prescriptions as of 08/23/2014  Medication Sig Note  . amLODipine (NORVASC) 10 MG tablet Take 1 tablet (10 mg total) by mouth daily.   . Glucosamine-Chondroit-Vit C-Mn (GLUCOSAMINE-CHONDROITIN) CAPS Take 1 capsule by mouth daily. 1500/1200   . metFORMIN (GLUCOPHAGE-XR) 500 MG 24 hr tablet Take 3 tablets (1,500 mg total) by mouth daily with breakfast.   . Multiple Vitamins-Minerals (MULTIVITAMIN WITH MINERALS) tablet Take 1 tablet by mouth daily.     . [DISCONTINUED] amLODipine (NORVASC) 10 MG tablet Take 1 tablet (10 mg total) by mouth daily.   . [DISCONTINUED] metFORMIN (GLUCOPHAGE-XR) 500 MG 24 hr tablet Take 2 tablets (1,000 mg total) by mouth daily with breakfast.   . Colesevelam HCl (WELCHOL) 3.75 G PACK Take 1 packet by mouth daily. (Patient not taking: Reported on  08/23/2014) 01/04/2014: Not taking due to cost; she never started it  . lisinopril (PRINIVIL,ZESTRIL) 10 MG tablet Take 1 tablet (10 mg total) by mouth daily.    (metformin was 1046m daily prior today's visit, and lisinopril was just started today).'  No Known Allergies  ROS:  No fevers, chills, headaches, dizziness, numbness, tingling, chest pain, urinary complaints, lesions/sores, rashes. Moods are good.  No chest pain, shortness of breath, URI symptoms, or other concerns, except as per HPI.  PHYSICAL EXAM: BP 136/82 mmHg  Pulse 100  Ht _0  (1.676 m)  Wt 224 lb (101.606 kg)  BMI 36.17 kg/m2 134/90 on repeat by MD, RA.  Pulse 88 on repeat.  Well developed, pleasant female in no distress  Neck: no lymphadenopathy, thyromegaly, carotid bruit or mass.  Heart: regular rate and rhythm without murmur  Lungs: clear bilaterally  Abdomen: soft, nontender, no mass, no organomegaly  Extremities: trace to 1+ pretibial edema bilaterally. 2+ pulse.  Skin: no rash, bruising  Psych: normal mood, affect, hygiene and grooming  Neuro: alert and oriented. Normal gait, strength, cranial nerves intact   Lab Results  Component Value Date   HGBA1C 7.8 08/23/2014   (up from 6.4 in May)  ASSESSMENT/PLAN:  Type 2 diabetes mellitus not at goal - significant worsening of sugar since last visit with poor diet over holidays, weight gain and not monitoring glu  Essential hypertension - above goal.  may be related to weight gain. Should be on ACEI--add to amlodipine (cut amlodip dose if too low) - Plan: amLODipine (NORVASC) 10 MG tablet, lisinopril (PRINIVIL,ZESTRIL) 10 MG tablet  Hyperlipidemia -  intolerant of statins.  Poor diet recently. reviewed diet-recheck at next visit (7wks). risks reviewed. consider nonstatin med if above goal  Diabetes mellitus without complication - Plan: HgB A1c, Microalbumin / creatinine urine ratio, Microalbumin / creatinine urine ratio  Type 2 diabetes mellitus  without complication - Plan: metFORMIN (GLUCOPHAGE-XR) 500 MG 24 hr tablet, POCT UA - Microalbumin, POCT UA - Microalbumin   DM--suboptimally controlled. Not checking sugars and poor diet over the holidays, with weight gain. Increase metformin to 1566m daily.  Get new monitor, and start checking regularly.  If/when sugars are back down (<125 in the mornings, preferably lower) than can consider cutting back to just 10018meach day, as long as sugars remain low (bump back up to 15003maily if sugars are higher on the lower dose). Daily exercise will help keep the sugars down, and lower the weight.  Blood pressure--elevated today.  Possibly related to dietary changes (also has some edema).  Low sodium diet reviewed..  Add lisinopril 26m77mce daily. Check blood pressure regularly.  Goal is <130/80, but acceptable up to 135/85.  If/when BP drops lower than 110-115/60-65, then cut the amlodipine in 1/2.  If it doesn't drop below 130/80, continue the amlodipine at the full tablet.    Urine microalbumin--check price on [facility] vs [facility].  At CPE--c-met, lipid, TSH Okay to skip chem today--last b-met was 06/2013 but normal; recheck at f/u (after starting ACEI today

## 2014-10-12 ENCOUNTER — Ambulatory Visit (INDEPENDENT_AMBULATORY_CARE_PROVIDER_SITE_OTHER): Payer: Self-pay | Admitting: Family Medicine

## 2014-10-12 ENCOUNTER — Encounter: Payer: Self-pay | Admitting: Family Medicine

## 2014-10-12 VITALS — BP 110/74 | HR 88 | Ht 65.5 in | Wt 214.0 lb

## 2014-10-12 DIAGNOSIS — E119 Type 2 diabetes mellitus without complications: Secondary | ICD-10-CM

## 2014-10-12 DIAGNOSIS — Z5181 Encounter for therapeutic drug level monitoring: Secondary | ICD-10-CM

## 2014-10-12 DIAGNOSIS — I10 Essential (primary) hypertension: Secondary | ICD-10-CM

## 2014-10-12 DIAGNOSIS — E785 Hyperlipidemia, unspecified: Secondary | ICD-10-CM

## 2014-10-12 DIAGNOSIS — Z0000 Encounter for general adult medical examination without abnormal findings: Secondary | ICD-10-CM

## 2014-10-12 LAB — POCT URINALYSIS DIPSTICK
Bilirubin, UA: NEGATIVE
Blood, UA: NEGATIVE
Glucose, UA: NEGATIVE
Ketones, UA: NEGATIVE
Leukocytes, UA: NEGATIVE
Nitrite, UA: NEGATIVE
PROTEIN UA: NEGATIVE
Spec Grav, UA: 1.025
UROBILINOGEN UA: NEGATIVE
pH, UA: 5.5

## 2014-10-12 MED ORDER — LISINOPRIL 10 MG PO TABS: 10.0000 mg | ORAL_TABLET | Freq: Every day | ORAL | Status: DC

## 2014-10-12 MED ORDER — METFORMIN HCL ER 500 MG PO TB24: 1500.0000 mg | ORAL_TABLET | Freq: Every day | ORAL | Status: DC

## 2014-10-12 NOTE — Progress Notes (Signed)
Chief Complaint  Patient presents with  . Annual Exam    nonfasting annual exam with pelvic/med check . Did not do eye exam as she has appt scheduled with Dr.Oman. No concerns.    Taylor Jones is a 64 y.o. female who presents for a complete physical.  She is also here to follow up on diabetes, hypertension and hyperlipidemia.  Diabetes: Diabetes was poorly controlled at last visit last month, after not monitoring sugars and eating poorly over the holidays.  A1c was up to 7.8.  Since that time she improved her diet, lost 10 pounds. Blood sugars are running usually <120-130, not checking other times of day. Denies polydipsia, polyuria. Checking feet without concerns. She has an upcoming eye exam scheduled. Denies vision problems; denies numbness, tingling.  Hypertension:BP was elevated at last visit, and lisinopril 71m was added to amlodipine.She hasn't been able to check BP elsewhere. Denies headaches, dizziness, chest pain, palpitations, shortness of breath, edema. Denies high sodium intake (makes her own soup/stock).  Hyperlipidemia--Intolerant of statins (her head was much clearer and she felt much better after stopping statins,and knee pain improved significantly after stopping). She tried three different statins. She was never willing to try Welchol (cost was a factor). Her diet was poor over the holidays, and as per last visit, we were holding off on checking her lipids until she improved her diet. She is not fasting today.  Immunization History  Administered Date(s) Administered  . Influenza Split 09/01/2011  . Influenza, Seasonal, Injecte, Preservative Fre 09/27/2012  . Influenza,inj,Quad PF,36+ Mos 07/07/2013  . Pneumococcal Conjugate-13 07/07/2013  . Tdap 05/10/2007  She didn't get a flu shot this year (and doesn't want) Last Pap smear: can't recall (9/08 per records); s/p hysterectomy (benign) Last mammogram: approx 03/2005 (never got last year) Last colonoscopy: 1/06  Last  DEXA: never  Dentist: twice yearly  Ophtho: yearly Exercise: 20-30 minutes of walking daily, Monday through Friday.  No weight-bearing exercise  Past Medical History  Diagnosis Date  . Hypertension   . Hyperlipidemia   . Impaired fasting glucose   . Osteoarthritis     of hands and knees  . Irregular menses   . Postmenopausal   . Internal hemorrhoids   . External hemorrhoids   . Diverticulosis   . Atypical endometrial hyperplasia     s/p TAH-BSO.  had bleeding from granulation tissue (requiring treatment with silver nitrate, eventually resolved)  . Diabetes mellitus     Past Surgical History  Procedure Laterality Date  . Cesarean section      x3  . Total abdominal hysterectomy w/ bilateral salpingoophorectomy  2006  . Tubal ligation    . Knee arthroscopy Right 2009    R knee, meniscal tear, Dr. GGladstone Lighter   History   Social History  . Marital Status: Married    Spouse Name: N/A  . Number of Children: 3  . Years of Education: N/A   Occupational History  . associate at [employer]  . Smoking status: Never Smoker   . Smokeless tobacco: Never Used  . Alcohol Use: Yes     Comment: Maybe one drink per week.  . Drug Use: No  . Sexual Activity:    Partners: Male   Other Topics Concern  . Not on file   Social History Narrative   Lives with husband. 1 bird.  [place]son [place]  She was caregiver for mother  who passed away (fall 2011).  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   They sold their business and house is on market.  Planning on back to [place].    Family History  Problem Relation Age of Onset  . Diabetes Mother     diet controlled  . Hypertension Mother   . Hyperlipidemia Mother   . Dementia Mother   . Heart attack Father   . Hyperlipidemia Father   . Hypertension Sister   . Hyperlipidemia Sister   . Osteoarthritis Sister   . Heart attack Brother   . Hyperlipidemia Brother   . Diabetes  Maternal Aunt   . Diabetes Maternal Grandmother   . Breast cancer Neg Hx   . Colon cancer Neg Hx     Outpatient Encounter Prescriptions as of 10/12/2014  Medication Sig Note  . amLODipine (NORVASC) 10 MG tablet Take 1 tablet (10 mg total) by mouth daily.   . Glucosamine-Chondroit-Vit C-Mn (GLUCOSAMINE-CHONDROITIN) CAPS Take 1 capsule by mouth daily. 1500/1200   . lisinopril (PRINIVIL,ZESTRIL) 10 MG tablet Take 1 tablet (10 mg total) by mouth daily.   . metFORMIN (GLUCOPHAGE-XR) 500 MG 24 hr tablet Take 3 tablets (1,500 mg total) by mouth daily with breakfast.   . Multiple Vitamins-Minerals (MULTIVITAMIN WITH MINERALS) tablet Take 1 tablet by mouth daily.     . Colesevelam HCl (WELCHOL) 3.75 G PACK Take 1 packet by mouth daily. (Patient not taking: Reported on 08/23/2014) 01/04/2014: Not taking due to cost; she never started it    No Known Allergies  ROS: The patient denies anorexia, fever, headaches, vision changes, ear pain, sore throat, breast concerns, chest pain, palpitations, dizziness, syncope, dyspnea on exertion, cough, swelling, nausea, vomiting, constipation, abdominal pain, melena, hematuria, incontinence, dysuria, vaginal bleeding, discharge, odor or itch, genital lesions, joint pains, numbness, tingling, weakness, tremor, suspicious skin lesions, depression, anxiety, abnormal bleeding/bruising, or enlarged lymph nodes. Arthritis in hands, feet, knees. Moods are okay, sleeping okay. Occasional heartburn, but not in the last month since diet improved (and weight loss). She lost 10# since her last visit. Urine has been darker than normal since starting lisinopril   PHYSICAL EXAM:  BP 110/74 mmHg  Pulse 88  Ht 5' 5.5" (1.664 m)  Wt 214 lb (97.07 kg)  BMI 35.06 kg/m2   General Appearance:  Alert, cooperative, no distress, appears stated age   Head:  Normocephalic, without obvious abnormality, atraumatic   Eyes:  PERRL, conjunctiva/corneas clear, EOM's intact, fundi   benign   Ears:  Normal TM's and external ear canals   Nose:  Nares normal, mucosa normal, no drainage or sinus tenderness   Throat:  Lips, mucosa, and tongue normal; teeth and gums normal   Neck:  Supple, no lymphadenopathy; thyroid: no enlargement/tenderness/nodules; no carotid  bruit or JVD   Back:  Spine nontender, no curvature, ROM normal, no CVA tenderness   Lungs:  Clear to auscultation bilaterally without wheezes, rales or ronchi; respirations unlabored   Chest Wall:  No tenderness or deformity   Heart:  Regular rate and rhythm, S1 and S2 normal, no murmur, rub  or gallop   Breast Exam:  No tenderness, masses, or nipple discharge or inversion. No axillary lymphadenopathy   Abdomen:  Soft, non-tender, nondistended, normoactive bowel sounds,  no masses, no hepatosplenomegaly   Genitalia:  Normal external genitalia without lesions. BUS and vagina normal, mild atrophic changes. No vaginal discharge. Bimanual exam normal--no mass or adnexal tenderness. Uterus surgically absent.   Rectal:  Normal tone, no masses  or tenderness; guaiac negative soft brown stool.  External hemorrhoids,the anterior-most of which is mildly inflamed  Extremities:  No clubbing, cyanosis or edema   Pulses:  2+ and symmetric all extremities   Skin:  Skin color, texture, turgor normal, no rashes or lesions  Lymph nodes:  Cervical, supraclavicular, and axillary nodes normal   Neurologic:  CNII-XII intact, normal strength, sensation and gait; reflexes 2+ and symmetric throughout    Psych:  Normal mood, affect, hygiene and grooming.           ASSESSMENT/PLAN:  Annual physical exam - Plan: POCT Urinalysis Dipstick  Diabetes mellitus without complication - improved control per home#'s with dietary changes, exercise and weight loss.  continue current regimen - Plan: Lipid panel, Comprehensive metabolic panel, TSH, CANCELED: HgB A1c  Medication  monitoring encounter - Plan: Comprehensive metabolic panel  Hyperlipidemia - goal LDL<100.  intolerant of 3 statins in the past.  Pt without insurance, and costs of nonstatin meds are high.  Return for fasting labs - Plan: Lipid panel  Type 2 diabetes mellitus without complication - Plan: metFORMIN (GLUCOPHAGE-XR) 500 MG 24 hr tablet  Essential hypertension - improved control.  continue lisinopril. due for chem panel (but will do when she returns fasting) - Plan: lisinopril (PRINIVIL,ZESTRIL) 10 MG tablet   Discussed monthly self breast exams and yearly mammograms--counseled extensively, and strongly encouraged to schedule mammogram; at least 30 minutes of aerobic activity at least 5 days/week, weight-bearing exercise 2-3x/week; proper sunscreen use reviewed; healthy diet, including goals of calcium and vitamin D intake and alcohol recommendations (less than or equal to 1 drink/day) reviewed; regular seatbelt use; changing batteries in smoke detectors. Immunization recommendations discussed--pneumovax age 26.Flu shots recommended annually (at this point, declines)--encouraged to get every Fall. Colonoscopy recommendations reviewed--due now.  Might want to hold off and do after she moves, unless can look into the low cost option here (given info)  c-met, lipid, TSH--return fasting for labs

## 2014-10-12 NOTE — Patient Instructions (Signed)
  HEALTH MAINTENANCE RECOMMENDATIONS:  It is recommended that you get at least 30 minutes of aerobic exercise at least 5 days/week (for weight loss, you may need as much as 60-90 minutes). This can be any activity that gets your heart rate up. This can be divided in 10-15 minute intervals if needed, but try and build up your endurance at least once a week.  Weight bearing exercise is also recommended twice weekly.  Eat a healthy diet with lots of vegetables, fruits and fiber.  "Colorful" foods have a lot of vitamins (ie green vegetables, tomatoes, red peppers, etc).  Limit sweet tea, regular sodas and alcoholic beverages, all of which has a lot of calories and sugar.  Up to 1 alcoholic drink daily may be beneficial for women (unless trying to lose weight, watch sugars).  Drink a lot of water.  Flu shots are recommended yearly in the fall Pneumovax is recommended at age 87. Zostavax (shingles vaccine) is recommended (but costs >$300--you might want to look into after you are on Medicare or have insurance) Calcium recommendations are 1200-1500 mg daily (1500 mg for postmenopausal women or women without ovaries), and vitamin D 1000 IU daily.  This should be obtained from diet and/or supplements (vitamins), and calcium should not be taken all at once, but in divided doses.  Monthly self breast exams and yearly mammograms for women over the age of 60 is recommended.  Sunscreen of at least SPF 30 should be used on all sun-exposed parts of the skin when outside between the hours of 10 am and 4 pm (not just when at beach or pool, but even with exercise, golf, tennis, and yard work!)  Use a sunscreen that says "broad spectrum" so it covers both UVA and UVB rays, and make sure to reapply every 1-2 hours.  Remember to change the batteries in your smoke detectors when changing your clock times in the spring and fall.  Use your seat belt every time you are in a car, and please drive safely and not be distracted  with cell phones and texting while driving.   Please schedule mammogram at the breast center (vs comparing cost with doing at [facility]) You are also due for colonoscopy (it has been 10 years) We will give you info re: potential low cost option here, or okay to wait and do after moving back to [place].

## 2014-10-13 ENCOUNTER — Other Ambulatory Visit: Payer: Self-pay

## 2014-10-13 DIAGNOSIS — E119 Type 2 diabetes mellitus without complications: Secondary | ICD-10-CM

## 2014-10-13 DIAGNOSIS — E785 Hyperlipidemia, unspecified: Secondary | ICD-10-CM

## 2014-10-13 DIAGNOSIS — Z0000 Encounter for general adult medical examination without abnormal findings: Secondary | ICD-10-CM

## 2014-10-13 DIAGNOSIS — Z5181 Encounter for therapeutic drug level monitoring: Secondary | ICD-10-CM

## 2014-10-13 LAB — COMPREHENSIVE METABOLIC PANEL
ALK PHOS: 57 U/L (ref 39–117)
ALT: 39 U/L — AB (ref 0–35)
AST: 24 U/L (ref 0–37)
Albumin: 4.2 g/dL (ref 3.5–5.2)
BILIRUBIN TOTAL: 0.6 mg/dL (ref 0.2–1.2)
BUN: 19 mg/dL (ref 6–23)
CO2: 26 meq/L (ref 19–32)
CREATININE: 1.09 mg/dL (ref 0.50–1.10)
Calcium: 9.5 mg/dL (ref 8.4–10.5)
Chloride: 105 mEq/L (ref 96–112)
Glucose, Bld: 105 mg/dL — ABNORMAL HIGH (ref 70–99)
Potassium: 4.5 mEq/L (ref 3.5–5.3)
Sodium: 140 mEq/L (ref 135–145)
Total Protein: 7.4 g/dL (ref 6.0–8.3)

## 2014-10-13 LAB — TSH: TSH: 1.586 u[IU]/mL (ref 0.350–4.500)

## 2014-10-13 LAB — LIPID PANEL
Cholesterol: 246 mg/dL — ABNORMAL HIGH (ref 0–200)
HDL: 51 mg/dL (ref >=46)
LDL Cholesterol: 172 mg/dL — ABNORMAL HIGH (ref 0–99)
Total CHOL/HDL Ratio: 4.8 Ratio
Triglycerides: 113 mg/dL (ref <150)
VLDL: 23 mg/dL (ref 0–40)

## 2014-10-18 ENCOUNTER — Encounter: Payer: Self-pay | Admitting: Gastroenterology

## 2015-02-10 ENCOUNTER — Other Ambulatory Visit: Payer: Self-pay | Admitting: Family Medicine

## 2015-02-16 ENCOUNTER — Other Ambulatory Visit: Payer: Self-pay | Admitting: Family Medicine

## 2015-02-16 NOTE — Telephone Encounter (Signed)
Patient has moved, is this okay for one last refill?

## 2015-02-16 NOTE — Telephone Encounter (Signed)
Ok to refill #90 days. She will be due for appt in August (for diabetes, etc), likely when all of her other meds will be due for refill.  She needs to establish with new doctor by then.  Okay for #90, no refill

## 2015-04-05 ENCOUNTER — Encounter: Payer: Self-pay | Admitting: Gastroenterology

## 2015-05-18 ENCOUNTER — Other Ambulatory Visit: Payer: Self-pay | Admitting: Family Medicine

## 2015-05-18 NOTE — Telephone Encounter (Signed)
Dr.Knapp pt was last seen 09/2014 looks as if [place] you gave her the med with 5 refills I wanted to make sure it was okay with you to refuse please advise

## 2015-05-22 ENCOUNTER — Telehealth: Payer: Self-pay | Admitting: *Deleted

## 2015-05-22 DIAGNOSIS — Z794 Long term (current) use of insulin: Secondary | ICD-10-CM

## 2015-05-22 DIAGNOSIS — E119 Type 2 diabetes mellitus without complications: Secondary | ICD-10-CM

## 2015-05-22 DIAGNOSIS — I10 Essential (primary) hypertension: Secondary | ICD-10-CM

## 2015-05-22 MED ORDER — METFORMIN HCL ER 500 MG PO TB24: 1500.0000 mg | ORAL_TABLET | Freq: Every day | ORAL | Status: AC

## 2015-05-22 MED ORDER — AMLODIPINE BESYLATE 10 MG PO TABS: 10.0000 mg | ORAL_TABLET | Freq: Every day | ORAL | Status: AC

## 2015-05-22 MED ORDER — LISINOPRIL 10 MG PO TABS: 10.0000 mg | ORAL_TABLET | Freq: Every day | ORAL | Status: AC

## 2015-05-22 NOTE — Telephone Encounter (Signed)
Moved to [place], cannot get appt with a new doctor until next month, can you please refill her lisinopril, metformin and amlodipine for another 30 days. If okay, I will call her back @ [phone] to get name of town that the [facility] [address] is in-could not make out on msg. Thanks.

## 2015-05-22 NOTE — Telephone Encounter (Signed)
Pt called back with with the address of the [facility] it is [address], [place] [address] [phone], and said thanks again so much,

## 2015-05-22 NOTE — Telephone Encounter (Signed)
Sent!

## 2015-05-22 NOTE — Telephone Encounter (Signed)
[place] for 30 days of each.  Wish her the best from me

## 2015-06-19 ENCOUNTER — Other Ambulatory Visit: Payer: Self-pay | Admitting: Family Medicine

## 2015-09-12 ENCOUNTER — Telehealth: Payer: Self-pay | Admitting: Family Medicine

## 2015-09-12 NOTE — Telephone Encounter (Signed)
Received records release to mail records to [facility]. Mailed to [facility] [place] [address] [place] [address].
